# Patient Record
Sex: Male | Born: 1945 | Race: White | Hispanic: No | Marital: Married | Smoking: Current some day smoker
Health system: Southern US, Community
[De-identification: ages and names within clinical notes are randomized; demographics above are authoritative.]

## PROBLEM LIST (undated history)

## (undated) DIAGNOSIS — G4700 Insomnia, unspecified: Secondary | ICD-10-CM

## (undated) DIAGNOSIS — G4730 Sleep apnea, unspecified: Secondary | ICD-10-CM

## (undated) DIAGNOSIS — I499 Cardiac arrhythmia, unspecified: Secondary | ICD-10-CM

## (undated) DIAGNOSIS — K219 Gastro-esophageal reflux disease without esophagitis: Secondary | ICD-10-CM

## (undated) DIAGNOSIS — M545 Low back pain, unspecified: Secondary | ICD-10-CM

## (undated) DIAGNOSIS — G2581 Restless legs syndrome: Secondary | ICD-10-CM

## (undated) DIAGNOSIS — Z86010 Personal history of colonic polyps: Secondary | ICD-10-CM

## (undated) DIAGNOSIS — Z8709 Personal history of other diseases of the respiratory system: Secondary | ICD-10-CM

## (undated) DIAGNOSIS — F4310 Post-traumatic stress disorder, unspecified: Secondary | ICD-10-CM

## (undated) DIAGNOSIS — F329 Major depressive disorder, single episode, unspecified: Secondary | ICD-10-CM

## (undated) DIAGNOSIS — M1990 Unspecified osteoarthritis, unspecified site: Secondary | ICD-10-CM

## (undated) DIAGNOSIS — E119 Type 2 diabetes mellitus without complications: Secondary | ICD-10-CM

## (undated) DIAGNOSIS — F32A Depression, unspecified: Secondary | ICD-10-CM

## (undated) DIAGNOSIS — T8859XA Other complications of anesthesia, initial encounter: Secondary | ICD-10-CM

## (undated) DIAGNOSIS — T4145XA Adverse effect of unspecified anesthetic, initial encounter: Secondary | ICD-10-CM

## (undated) DIAGNOSIS — I10 Essential (primary) hypertension: Secondary | ICD-10-CM

## (undated) DIAGNOSIS — J449 Chronic obstructive pulmonary disease, unspecified: Secondary | ICD-10-CM

## (undated) DIAGNOSIS — E785 Hyperlipidemia, unspecified: Secondary | ICD-10-CM

## (undated) DIAGNOSIS — R253 Fasciculation: Secondary | ICD-10-CM

## (undated) DIAGNOSIS — N400 Enlarged prostate without lower urinary tract symptoms: Secondary | ICD-10-CM

## (undated) HISTORY — PX: COLONOSCOPY: SHX174

## (undated) HISTORY — PX: CARDIOVERSION: SHX1299

## (undated) HISTORY — PX: CERVICAL FUSION: SHX112

## (undated) HISTORY — PX: OTHER SURGICAL HISTORY: SHX169

## (undated) HISTORY — PX: CHOLECYSTECTOMY: SHX55

## (undated) HISTORY — PX: APPENDECTOMY: SHX54

## (undated) HISTORY — PX: ESOPHAGOGASTRODUODENOSCOPY: SHX1529

---

## 2013-07-18 ENCOUNTER — Other Ambulatory Visit: Payer: Self-pay | Admitting: Neurosurgery

## 2013-07-25 NOTE — H&P (Signed)
> [address] , [place] [address] Phone: [phone]   Patient ID:   [id number] Patient: Glenn PlumpMichael Morgan  Date of Birth: 1946/04/28 Visit Type: Office Visit   Date: 07/17/2013 12:45 PM Provider: Danae OrleansJoseph D. Venetia MaxonStern MD   This 68 year old male presents for Follow Up of Neck pain.  History of Present Illness: 1.  Follow Up of Neck pain  Glenn Morgan returns to review his MRI  Imaging on Canopy  Patient had previous right-sided incision and anterior cervical decompression and fusion C3 through C6 levels.  He now has significant ptosis at the C6 C7 level which I believe is the basis for his symptomatic myelopathy.  I have recommended that he undergo surgery and this consisted of anterior cervical decompression and fusion C6 C6 7 with removal of prior hardware C3 through C6 levels.  This will be performed from right-sided approach.  He will need cardiac clearance and plan for Coumadin prior to and after surgery.  He is fitted for a soft cervical collar today.  He continues to have significant low back pain and lumbar spondylolisthesis and I told him that we would need to address his cervical spinal issues and then discuss those in the future.      Medical/Surgical/Interim History Reviewed, no change.  Last detailed document date:07/01/2013.   PAST MEDICAL HISTORY, SURGICAL HISTORY, FAMILY HISTORY, SOCIAL HISTORY AND REVIEW OF SYSTEMS I have reviewed the patient's past medical, surgical, family and social history as well as the comprehensive review of systems as included on the [facility] history form dated 07/01/2013, which I have signed.  Family History: Reviewed, no changes.  Last detailed document: 07/01/2013.   Social History: Tobacco use reviewed. Reviewed, no changes. Last detailed document date: 07/01/2013.      MEDICATIONS(added, continued or stopped this visit):   Started Medication Directions Instruction Stopped    Asmanex Twisthaler 220 mcg (30 doses) breath activated inhale 1 puff by inhalation route  every day at nighttime     aspirin 81 mg tablet,delayed release take 1 tablet by oral route  every day     diltiazem CD 240 mg capsule,extended release 24 hr take 1 capsule by oral route  every day     fenofibrate nanocrystallized 145 mg tablet take 1 tablet by oral route  every day     gabapentin 300 mg capsule take 2 capsule by oral route 3 times every day     glipizide 5 mg tablet take 1 tablet by oral route  every day before meals     hydrochlorothiazide 25 mg tablet take 1 tablet by oral route  every day     lisinopril 40 mg tablet take 1/2 tablet by oral route  every day     magnesium oxide 400 mg tablet 2 tablets daily     metformin 1,000 mg tablet take 1 tablet by oral route 2 times every day with morning and evening meals     metoprolol tartrate 50 mg tablet take 1/2 tablet by oral route once a day     Norco 5 mg-325 mg tablet take 1 tablet by oral route  every 6 hours as needed for pain     omeprazole 20 mg tablet,delayed release take 1 capsule by oral route  every day     Proventil HFA 90 mcg/actuation aerosol inhaler as needed     tamsulosin ER 0.4 mg capsule,extended release 24 hr take 1 capsule by oral route  every day  1/2 hour following the same meal each day     venlafaxine 75 mg tablet take 1 tablet by oral route 2 times every day with food     Vitamin B-12 500 mcg tablet 1 tablet daily     warfarin 5 mg tablet Take 7.5 mg on Mondays and 5 mg all other days     zolpidem 10 mg tablet take 1 tablet by oral route  every day at bedtime as needed      ALLERGIES:  Ingredient Reaction Medication Name Comment  KETOPROFEN   sores in mouth  Reviewed, no changes.   Vitals Date Temp F BP Pulse Ht In Wt Lb BMI BSA Pain Score  07/17/2013  103/74 94 68 206 31.32  0/10      DIAGNOSTIC RESULTS Diagnostic report text  CLINICAL DATA: Neck pain with bilateral arm pain and numbness. History of  cervical fusion in 2011.  EXAM: MRI CERVICAL SPINE WITHOUT CONTRAST  TECHNIQUE: Multiplanar, multisequence MR imaging was performed. No intravenous contrast was administered.  COMPARISON: None.  FINDINGS: The anterior loop coil could not be utilized due to patient body habitus. Detail is mildly limited.  The alignment is near anatomic. The patient is status post C3-6 ACDF with an anterior plate and screws. 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 The spinal canal is relatively small on a congenital basis. There is no evidence of acute fracture.  The craniocervical junction appears normal. The cervical cord is normal in signal and caliber. There are bilateral vertebral artery flow voids.  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 No spinal stenosis.  C3-4: Prior ACDF. There is mild residual uncinate spurring contributing to mild left greater than right foraminal stenosis. No cord deformity.  C4-5: Prior ACDF. There is mild residual uncinate spurring contributing to mild left greater than right foraminal stenosis. No cord deformity.  C5-6: Prior ACDF. Residual uncinate spurring contributes to mild to moderate foraminal narrowing bilaterally. No cord deformity.  C6-7: Adjacent segment disease with posterior osteophytes covering diffusely bulging disc material. The AP diameter of the canal is narrowed to 9 mm. There is no cord deformity. Moderate foraminal narrowing is present, worse on the left.  C7-T1: Left-greater-than-right facet hypertrophy with associated marrow edema. No significant spinal stenosis or nerve root encroachment.  IMPRESSION: 1. Prior C3-6 ACDF. There is some residual uncinate spurring contributing to mild to moderate foraminal narrowing as detailed above. 2. 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 3. 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 4. Left-greater-than-right  facet hypertrophy at C7-T1 without resulting spinal stenosis or nerve root encroachment.   Electronically Signed By: Roxy Horseman M.D. On: 07/11/2013 17:37   Embedded Images (not for diagnostic purposes)        IMPRESSION Patient has significant cervical stenosis at C6 C7 with an AP diameter of the spinal canal at 5.8 mm.  I have recommended that he undergo anterior cervical decompression and fusion at C6 C7 level with expiration of prior fusion C3 through C6 levels.  His lumbar spinal issues will be addressed at a later date.  Completed Orders (this encounter) Order Details Reason Side Interpretation Result Initial Treatment Date Region  Lifestyle education regarding diet f/u with pcp        Cervical Spine- AP/Lat/Flex/Ex W/SWIMMER'S     07/17/2013 All Levels to All Levels   Assessment/Plan # Detail Type Description   1. Assessment BMI 31.0-31.9,ADULT (V85.31).   Plan Orders Today's instructions /  counseling include(s) Lifestyle education regarding diet.       2. Assessment Cervical myelopathy (721.1).       3. 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       4. Assessment Cervical spinal stenosis (723.0).       5. Assessment Neck pain (723.1).         Pain Assessment/Treatment Pain Scale: 0/10. Method: Numeric Pain Intensity Scale.  Risks and benefits were discussed in detail with the patient wishes to proceed with surgery.  This is been scheduled for 08/02/13  Orders: Diagnostic Procedures: Assessment Procedure  721.1 Cervical Spine- AP/Lat/Flex/Ex  723.0 ACDF  - C6-C7 exploration of fusion C 3 - C 6 levels (right approach)  Instruction(s)/Education: Assessment Instruction  V85.31 Lifestyle education regarding diet             Provider:  Danae Orleans. Venetia Maxon MD  07/20/2013 06:01 PM Dictation edited by: Danae Orleans. Venetia Maxon    CC Providers: Maeola Harman MD [address] [place], [place]  [address]  ----------------------------------------------------------------------------------------------------------------------------------------------------------------------         Electronically signed by Danae Orleans. Venetia Maxon MD on 07/20/2013 06:02 PM   [address] [place], [place] [address] Phone: [phone]   Patient ID:   [id number] Patient: Glenn Morgan  Date of Birth: 1945-08-24 Visit Type: Office Visit   Date: 07/01/2013 03:30 PM Provider: Danae Orleans. Venetia Maxon MD   This 68 year old male presents for back pain.  History of Present Illness: 1.  back pain  Glenn Morgan, 68y.o. retired male, visits reporting some lumbar pain with walking, but voices his primary concern is uncontrollable leg spasms bilaterally.  He recalls no injury, but notes s/s began following myelogram in 2011 before his cervical surgery in [place].   Rx: Norco 5/325 prn, Gabapentin 300mg  2 TID.Marland KitchenMarland KitchenCoumadin for A.Fib  Hx: HTN, A.Fib., Barretts Esophagus, GERD, NIDDM, PTSD SxHx: Cervical surgery - DrSinger; 2012 Rt Rotator Cuff; 2001 Lt Rotator Cuff;  Appendix 1958; Gallbladder  1981 He has reduced his smoking from 2PPD to 3/4 PPD   Imaging on Canopy  Patient is complaining of low back and bilateral lower extremity pain and spasms.  He complains of back pain with walking.  He says this all began after myelogram he had in 2011.  He also notes numbness in both of his feet and weakness in both of his arms.  He is on Coumadin for atrial fibrillation.  He had prior cervical spinal surgery in May 2011.  He was performed by Dr. Krista Blue in [place].  Patient notes that he is only able to walk 50 yards a 40-assisted down because of low back and bilateral lower extremity pain.        PAST MEDICAL/SURGICAL HISTORY   (Detailed)  Disease/disorder Onset Date Management Date Comments    Cholecystectomy 1981     Hernia repair 1982     Appendectomy 1958   Atrial fibrillation       Depression      Diabetes type 2      GERD      High cholesterol      Hypertension      Neuropathy          Family History  (Detailed)  Relationship Family Member Name Deceased Age at Death Condition Onset Age Cause of Death      Family history of Hypertension  N  Father    Myocardial infarction  N  Mother    Cancer, ovarian  N   SOCIAL HISTORY  (  Detailed) Tobacco use reviewed. Preferred language is Unknown.   Smoking status: Heavy tobacco smoker.  SMOKING STATUS Use Status Type Smoking Status Usage Per Day Years Used Total Pack Years  yes Cigarette Heavy tobacco smoker 1 Packs     TOBACCO CESSATION INFORMATION Date Counseled By Order Status Description Code Tobacco Cessation Information  07/01/2013      Smoking cessation education          MEDICATIONS(added, continued or stopped this visit):   Started Medication Directions Instruction Stopped   Asmanex Twisthaler 220 mcg (30 doses) breath activated inhale 1 puff by inhalation route  every day at nighttime     aspirin 81 mg tablet,delayed release take 1 tablet by oral route  every day     diltiazem CD 240 mg capsule,extended release 24 hr take 1 capsule by oral route  every day     fenofibrate nanocrystallized 145 mg tablet take 1 tablet by oral route  every day     gabapentin 300 mg capsule take 2 capsule by oral route 3 times every day     glipizide 5 mg tablet take 1 tablet by oral route  every day before meals     hydrochlorothiazide 25 mg tablet take 1 tablet by oral route  every day     lisinopril 40 mg tablet take 1/2 tablet by oral route  every day     magnesium oxide 400 mg tablet 2 tablets daily     metformin 1,000 mg tablet take 1 tablet by oral route 2 times every day with morning and evening meals     metoprolol tartrate 50 mg tablet take 1/2 tablet by oral route once a day     Norco 5 mg-325 mg tablet take 1 tablet by oral route  every 6 hours as needed for pain     omeprazole 20 mg tablet,delayed  release take 1 capsule by oral route  every day     Proventil HFA 90 mcg/actuation aerosol inhaler as needed     tamsulosin ER 0.4 mg capsule,extended release 24 hr take 1 capsule by oral route  every day 1/2 hour following the same meal each day     venlafaxine 75 mg tablet take 1 tablet by oral route 2 times every day with food     Vitamin B-12 500 mcg tablet 1 tablet daily     warfarin 5 mg tablet Take 7.5 mg on Mondays and 5 mg all other days     zolpidem 10 mg tablet take 1 tablet by oral route  every day at bedtime as needed      ALLERGIES:  Ingredient Reaction Medication Name Comment  KETOPROFEN   sores in mouth  Reviewed, updated.  REVIEW OF SYSTEMS System Neg/Pos Details  Constitutional Negative Chills, fatigue, fever, malaise, night sweats, weight gain and weight loss.  ENMT Negative Ear drainage, hearing loss, nasal drainage, otalgia, sinus pressure and sore throat.  Eyes Negative Eye discharge, eye pain and vision changes.  Respiratory Negative Chronic cough, cough, dyspnea, known TB exposure and wheezing.  Cardio Positive AFib.  GI Negative Abdominal pain, blood in stool, change in stool pattern, constipation, decreased appetite, diarrhea, heartburn, nausea and vomiting.  GU Negative Dribbling, dysuria, erectile dysfunction, hematuria, polyuria, slow stream, urinary frequency, urinary incontinence and urinary retention.  Endocrine Negative Cold intolerance, heat intolerance, polydipsia and polyphagia.  Neuro Negative Dizziness, extremity weakness, gait disturbance, headache, memory impairment, numbness in extremity, seizures and tremors.  Psych Negative Anxiety, depression and  insomnia.  Integumentary Negative Brittle hair, brittle nails, change in shape/size of mole(s), hair loss, hirsutism, hives, pruritus, rash and skin lesion.  MS Positive Back pain, Bilat leg spasms.  Hema/Lymph Negative Easy bleeding, easy bruising and lymphadenopathy.  Allergic/Immuno Negative  Contact allergy, environmental allergies, food allergies and seasonal allergies.  Reproductive Negative Penile discharge and sexual dysfunction.    Vitals Date Temp F BP Pulse Ht In Wt Lb BMI BSA Pain Score  07/01/2013  113/75 109 68 205 31.17  6/10     PHYSICAL EXAM General Level of Distress: no acute distress Overall Appearance: normal  Head and Face  Right Left  Fundoscopic Exam:  normal normal    Cardiovascular Cardiac: regular rate and rhythm without murmur  Right Left  Carotid Pulses: normal normal  Respiratory Lungs: clear to auscultation  Neurological Orientation: normal Recent and Remote Memory: normal Attention Span and Concentration:   normal Language: normal Fund of Knowledge: normal  Right Left Sensation: normal normal Upper Extremity Coordination: normal normal  Lower Extremity Coordination: normal normal  Musculoskeletal Gait and Station: normal  Right Left Upper Extremity Muscle Strength: normal normal Lower Extremity Muscle Strength: normal normal Upper Extremity Muscle Tone:  normal normal Lower Extremity Muscle Tone: normal normal  Motor Strength Upper and lower extremity motor strength was tested in the clinically pertinent muscles .Any abnormal findings will be noted below..   Right Left Grip: 4/5 4/5 Finger Extensor: 4/5 4/5  Deep Tendon Reflexes  Right Left Biceps: increase increase Triceps: increase increase Brachiloradialis: increase increase Patellar: increase increase Achilles: increase increase  Cranial Nerves II. Optic Nerve/Visual Fields: normal III. Oculomotor: normal IV. Trochlear: normal V. Trigeminal: normal VI. Abducens: normal VII. Facial: normal VIII. Acoustic/Vestibular: normal IX. Glossopharyngeal: normal X. Vagus: normal XI. Spinal Accessory: normal XII. Hypoglossal: normal  Motor and other Tests Lhermittes: negative Rhomberg: negative Pronator  drift: absent     Right Left Spurlings negative negative Hoffman's: normal normal Clonus: normal normal Babinski: normal normal SLR: positive at 45 degrees positive at 45 degrees   Additional Findings:  Patient has bilaterally positive crossed adductor and suprapatellar reflexes at the knees.  He is able to stand on his heels and toes and get some relief with leaning forward.    DIAGNOSTIC RESULTS Lumbar spinal radiographs were obtained in the office today which show that he has spondylolisthesis of L3 on L4 and l4 and L5.  In flexion L3 L4 has 11 mm anterolisthesis and 10 mm at L4 L5 these change to 6 mm at L3 L4 and extension and 9 mm at L45 and extension and 7 mm at L3 L4 neutral lateral radiograph and 8 mm at L4 L5 on neutral lateral radiograph.  On MRI of his lumbar spine he has anterolisthesis of L3 on L4 and at L4-L5 with what appears to be autofusion at the L5-S1 level with spondylosis and stenosis at the L3 L4 and L4 L5 levels    IMPRESSION While the patient has lumbar spondylolisthesis and low back pain, he also appears to have significant hyperreflexia and has bilateral hand intrinsic weakness along with numbness into both of his hands.  He has had prior cervical spinal surgery.  I have recommended we obtain a cervical MRI and cervical radiographs to make sure he does not have significant cord compression or basis for myelopathy.  Completed Orders (this encounter) Order Details Reason Side Interpretation Result Initial Treatment Date Region  Dietary management education, guidance, and counseling f/u with pcp  Lumbar Spine- AP/Lat/Obls/Spot/Flex/Ex      07/01/2013 All Levels to All Levels   Assessment/Plan # Detail Type Description   1. Assessment Obesity (278.00).   Plan Orders Today's instructions / counseling include(s) Dietary management education, guidance, and counseling.         Pain Assessment/Treatment Pain Scale: 6/10. Method: Numeric Pain Intensity  Scale. Pain Assessment/Treatment follow-up plan of care: Patient taking pain medication..  I will see the patient back after cervical imaging has been performed.  Orders: Diagnostic Procedures: Assessment Procedure   Lumbar Spine- AP/Lat/Obls/Spot/Flex/Ex  Instruction(s)/Education: Assessment Instruction  278.00 Dietary management education, guidance, and counseling             Provider:  Danae Orleans. Venetia Maxon MD  07/06/2013 03:09 PM Dictation edited by: Danae Orleans. Venetia Maxon    CC Providers: Maeola Harman MD [address] [place], [place] [address]  ----------------------------------------------------------------------------------------------------------------------------------------------------------------------         Electronically signed by Danae Orleans. Venetia Maxon MD on 07/06/2013 03:09 PM

## 2013-07-26 ENCOUNTER — Encounter (HOSPITAL_COMMUNITY): Payer: Self-pay

## 2013-07-26 ENCOUNTER — Encounter (HOSPITAL_COMMUNITY)
Admission: RE | Admit: 2013-07-26 | Discharge: 2013-07-26 | Disposition: A | Discharge status: Home / Self Care | Payer: Medicare Other | Source: Ambulatory Visit | Attending: Neurosurgery | Admitting: Neurosurgery

## 2013-07-26 DIAGNOSIS — Z01812 Encounter for preprocedural laboratory examination: Secondary | ICD-10-CM | POA: Insufficient documentation

## 2013-07-26 HISTORY — DX: Benign prostatic hyperplasia without lower urinary tract symptoms: N40.0

## 2013-07-26 HISTORY — DX: Essential (primary) hypertension: I10

## 2013-07-26 HISTORY — DX: Sleep apnea, unspecified: G47.30

## 2013-07-26 HISTORY — DX: Chronic obstructive pulmonary disease, unspecified: J44.9

## 2013-07-26 HISTORY — DX: Personal history of other diseases of the respiratory system: Z87.09

## 2013-07-26 HISTORY — DX: Fasciculation: R25.3

## 2013-07-26 HISTORY — DX: Insomnia, unspecified: G47.00

## 2013-07-26 HISTORY — DX: Adverse effect of unspecified anesthetic, initial encounter: T41.45XA

## 2013-07-26 HISTORY — DX: Hyperlipidemia, unspecified: E78.5

## 2013-07-26 HISTORY — DX: Cardiac arrhythmia, unspecified: I49.9

## 2013-07-26 HISTORY — DX: Restless legs syndrome: G25.81

## 2013-07-26 HISTORY — DX: Major depressive disorder, single episode, unspecified: F32.9

## 2013-07-26 HISTORY — DX: Depression, unspecified: F32.A

## 2013-07-26 HISTORY — DX: Other complications of anesthesia, initial encounter: T88.59XA

## 2013-07-26 HISTORY — DX: Personal history of colonic polyps: Z86.010

## 2013-07-26 HISTORY — DX: Gastro-esophageal reflux disease without esophagitis: K21.9

## 2013-07-26 HISTORY — DX: Post-traumatic stress disorder, unspecified: F43.10

## 2013-07-26 HISTORY — DX: Low back pain: M54.5

## 2013-07-26 HISTORY — DX: Low back pain, unspecified: M54.50

## 2013-07-26 HISTORY — DX: Unspecified osteoarthritis, unspecified site: M19.90

## 2013-07-26 HISTORY — DX: Type 2 diabetes mellitus without complications: E11.9

## 2013-07-26 LAB — CBC
HCT: 42.2 % (ref 39.0–52.0)
HEMOGLOBIN: 14.7 g/dL (ref 13.0–17.0)
MCH: 31 pg (ref 26.0–34.0)
MCHC: 34.8 g/dL (ref 30.0–36.0)
MCV: 89 fL (ref 78.0–100.0)
PLATELETS: 226 10*3/uL (ref 150–400)
RBC: 4.74 MIL/uL (ref 4.22–5.81)
RDW: 14.6 % (ref 11.5–15.5)
WBC: 7.2 10*3/uL (ref 4.0–10.5)

## 2013-07-26 LAB — BASIC METABOLIC PANEL
BUN: 14 mg/dL (ref 6–23)
CO2: 27 mEq/L (ref 19–32)
Calcium: 9.1 mg/dL (ref 8.4–10.5)
Chloride: 96 mEq/L (ref 96–112)
Creatinine, Ser: 0.76 mg/dL (ref 0.50–1.35)
GLUCOSE: 183 mg/dL — AB (ref 70–99)
POTASSIUM: 4.1 meq/L (ref 3.7–5.3)
SODIUM: 138 meq/L (ref 137–147)

## 2013-07-26 LAB — SURGICAL PCR SCREEN
MRSA, PCR: NEGATIVE
STAPHYLOCOCCUS AUREUS: POSITIVE — AB

## 2013-07-26 NOTE — Progress Notes (Signed)
Pt notified of positive PCR of staph. Rx for Mupirocin ointment called into [facility] on [address] [place].

## 2013-07-26 NOTE — Pre-Procedure Instructions (Signed)
Wanda PlumpMichael Principato  07/26/2013   Your procedure is scheduled on:  Fri, Mar 27@ 7:30 AM  [facility]  at 5:30 AM.  Call this number if you have problems the morning of surgery: [phone]   Remember:   Do not eat food or drink liquids after midnight.   Take these medicines the morning of surgery with A SIP OF WATER: Diltiazem(Cardizem),Gabapentin(Neurontin),Metoprolol(Lopressor),Pain Pill(if needed),Omeprazole(Prilosec),Proventin<Bring Your Inhaler With You>,Effexor(Venlafaxine),and Flomax(Tamsulosin)               Stop taking your Coumadin as instructed. No Goody's,BC's,Aleve,Aspirin,Ibuprofen,Fish Oil,or any Herbal Medications   Do not wear jewelry  Do not wear lotions, powders, or colognes. You may wear deodorant.  Men may shave face and neck.  Do not bring valuables to the hospital.  [facility] is not responsible                  for any belongings or valuables.               Contacts, dentures or bridgework may not be worn into surgery.  Leave suitcase in the car. After surgery it may be brought to your room.  For patients admitted to the hospital, discharge time is determined by your                treatment team.               Patients discharged the day of surgery will not be allowed to drive  home.    Special Instructions:  [place] - Preparing for Surgery  Before surgery, you can play an important role.  Because skin is not sterile, your skin needs to be as free of germs as possible.  You can reduce the number of germs on you skin by washing with CHG (chlorahexidine gluconate) soap before surgery.  CHG is an antiseptic cleaner which kills germs and bonds with the skin to continue killing germs even after washing.  Please DO NOT use if you have an allergy to CHG or antibacterial soaps.  If your skin becomes reddened/irritated stop using the CHG and inform your nurse when you arrive at Short Stay.  Do not shave (including legs and underarms) for at least  48 hours prior to the first CHG shower.  You may shave your face.  Please follow these instructions carefully:   1.  Shower with CHG Soap the night before surgery and the                                morning of Surgery.  2.  If you choose to wash your hair, wash your hair first as usual with your       normal shampoo.  3.  After you shampoo, rinse your hair and body thoroughly to remove the                      Shampoo.  4.  Use CHG as you would any other liquid soap.  You can apply chg directly       to the skin and wash gently with scrungie or a clean washcloth.  5.  Apply the CHG Soap to your body ONLY FROM THE NECK DOWN.        Do not use on open wounds or open sores.  Avoid contact with your eyes,       ears, mouth and  genitals (private parts).  Wash genitals (private parts)       with your normal soap.  6.  Wash thoroughly, paying special attention to the area where your surgery        will be performed.  7.  Thoroughly rinse your body with warm water from the neck down.  8.  DO NOT shower/wash with your normal soap after using and rinsing off       the CHG Soap.  9.  Pat yourself dry with a clean towel.            10.  Wear clean pajamas.            11.  Place clean sheets on your bed the night of your first shower and do not        sleep with pets.  Day of Surgery  Do not apply any lotions/deoderants the morning of surgery.  Please wear clean clothes to the hospital/surgery center.     Please read over the following fact sheets that you were given: Pain Booklet, Coughing and Deep Breathing, MRSA Information and Surgical Site Infection Prevention

## 2013-07-26 NOTE — Progress Notes (Addendum)
[place] [facility].Next visit next week-will need to request report  [facility]  Stress [place] around 2011[place]   Denies ever having a heart cath  [place]  [place]   [place] [facility]

## 2013-07-29 ENCOUNTER — Encounter (HOSPITAL_COMMUNITY): Payer: Self-pay | Admitting: Pharmacy Technician

## 2013-07-29 NOTE — Progress Notes (Addendum)
Anesthesia chart review: Patient is a [age] year old male scheduled for right C6-7 ACDF with exploration of fusion C3-6 on 08/02/13 by Dr. Venetia MaxonStern.  History includes smoking, afib with history of cardioversion '07 with recurrence (on Coumadin), HTN, HLD, COPD, OSA without CPAP use, RLS, GERD, DM2, BPH, depression, PTSD, cervical fusion '11 (Dr. Krista BlueSinger in [place], [place]), arthritis cholecystectomy. PCP is Dr. Wallace Cullensharles Bethea in [place].  Cardiologist is Dr. Gerilyn PilgrimJarmukli with [place] [facility].  He saw Leonie ManKatherine Henley, NP on 06/03/13, but with reported a visit scheduled for this week. Dr. Venetia MaxonStern has requested cardiac clearance, but his office is still waiting to hear back from Dr. Gerilyn PilgrimJarmukli.  EKG on 06/03/13 [facility]) showed: Afib @ 95 bpm, non-specific T wave abnormality.  Echo on 09/04/09 [facility]) showed: Left ventricular systolic function is normal. Left ventricle is normal in size. Left atrium is mildly enlarged. Right atrium is normal in size. Right ventricle is normal in size. Right ventricular contractility is normal. Aortic root is normal in size.   Myoview on 02/12/08 [facility]) showed: 1. There is no definite evidence of ischemia. 2. Normal left ventricular cavity. 3. There is mild global hypokinesis. 4. Left ventricular ejection fraction is 40%.  CXR on 02/04/13 [facility]) showed: Cardiomegaly with no active lung disease identified. There is the previously noted marked elevation of the left hemidiaphragm and this remains unchanged. Right CP angle is sharp. No mediastinal widening or abnormality observed. Impression: No active disease. Marked elevation, benign in appearance, of the left hemidiaphragm.  Preoperative labs noted.  He will need a PT/PTT preoperatively. Jessica at Dr. Fredrich BirksStern's office reports that patient was told to hold Coumadin for 5 days preoperatively and to check with his cardiologist regarding if he needed a Lovenox bridge.  As above, patient reported a visit with cardiology this week. Shanda BumpsJessica with follow-up  with patient and his cardiologist regarding clearance.  Velna Ochsllison Mumin Denomme, PA-C [facility]/Anesthesiology Phone [phone] 07/29/2013 11:25 AM  Addendum: 07/30/2013 6:25 PM Received a note of cardiac clearance today from Dr. Gerilyn PilgrimJarmukli stating that patient is low risk for moderate risk surgery, continue cardiac medications on the day of surgery, may hold warfarin without bridging as needed preoperatively.

## 2013-08-01 MED ORDER — CEFAZOLIN SODIUM-DEXTROSE 2-3 GM-% IV SOLR
2.0000 g | INTRAVENOUS | Status: AC
  Administered 2013-08-02: 2 g via INTRAVENOUS
  Filled 2013-08-01: qty 50

## 2013-08-02 ENCOUNTER — Encounter (HOSPITAL_COMMUNITY)
Admission: RE | Disposition: A | Discharge status: Home / Self Care | Payer: Self-pay | Source: Ambulatory Visit | Attending: Neurosurgery

## 2013-08-02 ENCOUNTER — Encounter (HOSPITAL_COMMUNITY): Payer: Self-pay | Admitting: *Deleted

## 2013-08-02 ENCOUNTER — Inpatient Hospital Stay (HOSPITAL_COMMUNITY): Payer: Medicare Other | Admitting: Certified Registered"

## 2013-08-02 ENCOUNTER — Inpatient Hospital Stay (HOSPITAL_COMMUNITY)
Admission: RE | Admit: 2013-08-02 | Discharge: 2013-08-05 | DRG: 472 | Disposition: A | Discharge status: Home / Self Care | Payer: Medicare Other | Source: Ambulatory Visit | Attending: Neurosurgery | Admitting: Neurosurgery

## 2013-08-02 ENCOUNTER — Encounter (HOSPITAL_COMMUNITY): Payer: Medicare Other | Admitting: Vascular Surgery

## 2013-08-02 ENCOUNTER — Inpatient Hospital Stay (HOSPITAL_COMMUNITY): Payer: Medicare Other

## 2013-08-02 DIAGNOSIS — Z981 Arthrodesis status: Secondary | ICD-10-CM

## 2013-08-02 DIAGNOSIS — E669 Obesity, unspecified: Secondary | ICD-10-CM | POA: Diagnosis present

## 2013-08-02 DIAGNOSIS — M129 Arthropathy, unspecified: Secondary | ICD-10-CM | POA: Diagnosis present

## 2013-08-02 DIAGNOSIS — F17200 Nicotine dependence, unspecified, uncomplicated: Secondary | ICD-10-CM

## 2013-08-02 DIAGNOSIS — M545 Low back pain, unspecified: Secondary | ICD-10-CM | POA: Diagnosis present

## 2013-08-02 DIAGNOSIS — N400 Enlarged prostate without lower urinary tract symptoms: Secondary | ICD-10-CM | POA: Diagnosis present

## 2013-08-02 DIAGNOSIS — E1149 Type 2 diabetes mellitus with other diabetic neurological complication: Secondary | ICD-10-CM | POA: Diagnosis present

## 2013-08-02 DIAGNOSIS — E119 Type 2 diabetes mellitus without complications: Secondary | ICD-10-CM | POA: Diagnosis present

## 2013-08-02 DIAGNOSIS — E1142 Type 2 diabetes mellitus with diabetic polyneuropathy: Secondary | ICD-10-CM | POA: Diagnosis present

## 2013-08-02 DIAGNOSIS — G2581 Restless legs syndrome: Secondary | ICD-10-CM | POA: Diagnosis present

## 2013-08-02 DIAGNOSIS — Z7982 Long term (current) use of aspirin: Secondary | ICD-10-CM

## 2013-08-02 DIAGNOSIS — R0902 Hypoxemia: Secondary | ICD-10-CM

## 2013-08-02 DIAGNOSIS — E785 Hyperlipidemia, unspecified: Secondary | ICD-10-CM | POA: Diagnosis present

## 2013-08-02 DIAGNOSIS — J9819 Other pulmonary collapse: Secondary | ICD-10-CM | POA: Diagnosis not present

## 2013-08-02 DIAGNOSIS — J9811 Atelectasis: Secondary | ICD-10-CM

## 2013-08-02 DIAGNOSIS — I4891 Unspecified atrial fibrillation: Secondary | ICD-10-CM | POA: Diagnosis present

## 2013-08-02 DIAGNOSIS — J986 Disorders of diaphragm: Secondary | ICD-10-CM | POA: Diagnosis present

## 2013-08-02 DIAGNOSIS — J441 Chronic obstructive pulmonary disease with (acute) exacerbation: Secondary | ICD-10-CM

## 2013-08-02 DIAGNOSIS — K219 Gastro-esophageal reflux disease without esophagitis: Secondary | ICD-10-CM | POA: Diagnosis present

## 2013-08-02 DIAGNOSIS — M4722 Other spondylosis with radiculopathy, cervical region: Secondary | ICD-10-CM

## 2013-08-02 DIAGNOSIS — G4700 Insomnia, unspecified: Secondary | ICD-10-CM | POA: Diagnosis present

## 2013-08-02 DIAGNOSIS — I10 Essential (primary) hypertension: Secondary | ICD-10-CM | POA: Diagnosis present

## 2013-08-02 DIAGNOSIS — Z7901 Long term (current) use of anticoagulants: Secondary | ICD-10-CM

## 2013-08-02 DIAGNOSIS — J449 Chronic obstructive pulmonary disease, unspecified: Secondary | ICD-10-CM

## 2013-08-02 DIAGNOSIS — M4712 Other spondylosis with myelopathy, cervical region: Principal | ICD-10-CM | POA: Diagnosis present

## 2013-08-02 HISTORY — PX: ANTERIOR CERVICAL DECOMP/DISCECTOMY FUSION: SHX1161

## 2013-08-02 LAB — GLUCOSE, CAPILLARY
GLUCOSE-CAPILLARY: 153 mg/dL — AB (ref 70–99)
Glucose-Capillary: 152 mg/dL — ABNORMAL HIGH (ref 70–99)
Glucose-Capillary: 254 mg/dL — ABNORMAL HIGH (ref 70–99)

## 2013-08-02 LAB — HEMOGLOBIN A1C
Hgb A1c MFr Bld: 7.9 % — ABNORMAL HIGH
Mean Plasma Glucose: 180 mg/dL — ABNORMAL HIGH

## 2013-08-02 LAB — PROTIME-INR
INR: 1.1 (ref 0.00–1.49)
Prothrombin Time: 14 seconds (ref 11.6–15.2)

## 2013-08-02 LAB — APTT: APTT: 30 s (ref 24–37)

## 2013-08-02 SURGERY — ANTERIOR CERVICAL DECOMPRESSION/DISCECTOMY FUSION 1 LEVEL/HARDWARE REMOVAL
Anesthesia: General | Site: Neck

## 2013-08-02 MED ORDER — HYDROCHLOROTHIAZIDE 25 MG PO TABS
25.0000 mg | ORAL_TABLET | Freq: Every day | ORAL | Status: DC
  Administered 2013-08-03: 25 mg via ORAL
  Filled 2013-08-02 (×4): qty 1

## 2013-08-02 MED ORDER — ALUM & MAG HYDROXIDE-SIMETH 200-200-20 MG/5ML PO SUSP
30.0000 mL | Freq: Four times a day (QID) | ORAL | Status: DC | PRN
  Administered 2013-08-03: 30 mL via ORAL
  Filled 2013-08-02: qty 30

## 2013-08-02 MED ORDER — ALBUTEROL SULFATE HFA 108 (90 BASE) MCG/ACT IN AERS
INHALATION_SPRAY | RESPIRATORY_TRACT | Status: DC | PRN
  Administered 2013-08-02 (×2): 2 via RESPIRATORY_TRACT

## 2013-08-02 MED ORDER — ONDANSETRON HCL 4 MG/2ML IJ SOLN: 4.0000 mg | INTRAMUSCULAR | Status: DC | PRN

## 2013-08-02 MED ORDER — MENTHOL 3 MG MT LOZG: 1.0000 | LOZENGE | OROMUCOSAL | Status: DC | PRN

## 2013-08-02 MED ORDER — KCL IN DEXTROSE-NACL 20-5-0.45 MEQ/L-%-% IV SOLN
INTRAVENOUS | Status: DC
  Administered 2013-08-02: 14:00:00 via INTRAVENOUS
  Filled 2013-08-02 (×3): qty 1000

## 2013-08-02 MED ORDER — PROPOFOL 10 MG/ML IV BOLUS
INTRAVENOUS | Status: AC
  Filled 2013-08-02: qty 20

## 2013-08-02 MED ORDER — DILTIAZEM HCL ER 240 MG PO CP24
240.0000 mg | ORAL_CAPSULE | Freq: Every day | ORAL | Status: DC
  Administered 2013-08-02 – 2013-08-05 (×4): 240 mg via ORAL
  Filled 2013-08-02 (×4): qty 1

## 2013-08-02 MED ORDER — ROCURONIUM BROMIDE 50 MG/5ML IV SOLN
INTRAVENOUS | Status: AC
  Filled 2013-08-02: qty 1

## 2013-08-02 MED ORDER — ALBUTEROL SULFATE (2.5 MG/3ML) 0.083% IN NEBU
INHALATION_SOLUTION | RESPIRATORY_TRACT | Status: AC
  Filled 2013-08-02: qty 3

## 2013-08-02 MED ORDER — MAGNESIUM OXIDE 400 (241.3 MG) MG PO TABS
800.0000 mg | ORAL_TABLET | Freq: Every day | ORAL | Status: DC
  Administered 2013-08-02 – 2013-08-05 (×4): 800 mg via ORAL
  Filled 2013-08-02 (×4): qty 2

## 2013-08-02 MED ORDER — SODIUM CHLORIDE 0.9 % IJ SOLN: 3.0000 mL | INTRAMUSCULAR | Status: DC | PRN

## 2013-08-02 MED ORDER — GLYCOPYRROLATE 0.2 MG/ML IJ SOLN
INTRAMUSCULAR | Status: DC | PRN
  Administered 2013-08-02 (×2): 0.4 mg via INTRAVENOUS

## 2013-08-02 MED ORDER — ASPIRIN EC 81 MG PO TBEC
81.0000 mg | DELAYED_RELEASE_TABLET | Freq: Every day | ORAL | Status: DC
  Administered 2013-08-02 – 2013-08-05 (×4): 81 mg via ORAL
  Filled 2013-08-02 (×4): qty 1

## 2013-08-02 MED ORDER — GLYCOPYRROLATE 0.2 MG/ML IJ SOLN
INTRAMUSCULAR | Status: AC
  Filled 2013-08-02: qty 2

## 2013-08-02 MED ORDER — 0.9 % SODIUM CHLORIDE (POUR BTL) OPTIME
TOPICAL | Status: DC | PRN
  Administered 2013-08-02: 1000 mL

## 2013-08-02 MED ORDER — TAMSULOSIN HCL 0.4 MG PO CAPS
0.4000 mg | ORAL_CAPSULE | Freq: Every day | ORAL | Status: DC
  Administered 2013-08-02 – 2013-08-05 (×4): 0.4 mg via ORAL
  Filled 2013-08-02 (×4): qty 1

## 2013-08-02 MED ORDER — LIDOCAINE HCL 4 % MT SOLN
OROMUCOSAL | Status: DC | PRN
  Administered 2013-08-02: 4 mL via TOPICAL

## 2013-08-02 MED ORDER — PANTOPRAZOLE SODIUM 40 MG IV SOLR: 40.0000 mg | Freq: Every day | INTRAVENOUS | Status: DC

## 2013-08-02 MED ORDER — INSULIN ASPART 100 UNIT/ML ~~LOC~~ SOLN
0.0000 [IU] | Freq: Three times a day (TID) | SUBCUTANEOUS | Status: DC
  Administered 2013-08-03: 2 [IU] via SUBCUTANEOUS
  Administered 2013-08-03: 3 [IU] via SUBCUTANEOUS

## 2013-08-02 MED ORDER — GELATIN ABSORBABLE MT POWD
OROMUCOSAL | Status: DC | PRN
  Administered 2013-08-02: 09:00:00 via TOPICAL

## 2013-08-02 MED ORDER — HYDROMORPHONE HCL PF 1 MG/ML IJ SOLN
0.2500 mg | INTRAMUSCULAR | Status: DC | PRN
  Administered 2013-08-02 (×3): 0.25 mg via INTRAVENOUS

## 2013-08-02 MED ORDER — BISACODYL 10 MG RE SUPP
10.0000 mg | Freq: Every day | RECTAL | Status: DC | PRN
  Administered 2013-08-03 – 2013-08-04 (×2): 10 mg via RECTAL
  Filled 2013-08-02 (×2): qty 1

## 2013-08-02 MED ORDER — MAGNESIUM OXIDE 400 MG PO TABS: 800.0000 mg | ORAL_TABLET | Freq: Every day | ORAL | Status: DC

## 2013-08-02 MED ORDER — HYDROCODONE-ACETAMINOPHEN 5-325 MG PO TABS: 1.0000 | ORAL_TABLET | Freq: Four times a day (QID) | ORAL | Status: DC | PRN

## 2013-08-02 MED ORDER — ARTIFICIAL TEARS OP OINT
TOPICAL_OINTMENT | OPHTHALMIC | Status: AC
  Filled 2013-08-02: qty 3.5

## 2013-08-02 MED ORDER — PHENYLEPHRINE HCL 10 MG/ML IJ SOLN
INTRAMUSCULAR | Status: DC | PRN
  Administered 2013-08-02 (×2): 80 ug via INTRAVENOUS

## 2013-08-02 MED ORDER — MIDAZOLAM HCL 2 MG/2ML IJ SOLN
INTRAMUSCULAR | Status: AC
  Filled 2013-08-02: qty 2

## 2013-08-02 MED ORDER — LACTATED RINGERS IV SOLN
INTRAVENOUS | Status: DC | PRN
  Administered 2013-08-02 (×2): via INTRAVENOUS

## 2013-08-02 MED ORDER — MORPHINE SULFATE 2 MG/ML IJ SOLN: 1.0000 mg | INTRAMUSCULAR | Status: DC | PRN

## 2013-08-02 MED ORDER — NEOSTIGMINE METHYLSULFATE 1 MG/ML IJ SOLN
INTRAMUSCULAR | Status: DC | PRN
  Administered 2013-08-02: 4 mg via INTRAVENOUS

## 2013-08-02 MED ORDER — MIDAZOLAM HCL 5 MG/5ML IJ SOLN
INTRAMUSCULAR | Status: DC | PRN
  Administered 2013-08-02: 1 mg via INTRAVENOUS

## 2013-08-02 MED ORDER — CEFAZOLIN SODIUM 1-5 GM-% IV SOLN
1.0000 g | Freq: Three times a day (TID) | INTRAVENOUS | Status: AC
  Administered 2013-08-02 (×2): 1 g via INTRAVENOUS
  Filled 2013-08-02 (×2): qty 50

## 2013-08-02 MED ORDER — FENTANYL CITRATE 0.05 MG/ML IJ SOLN
INTRAMUSCULAR | Status: DC | PRN
  Administered 2013-08-02: 50 ug via INTRAVENOUS
  Administered 2013-08-02: 100 ug via INTRAVENOUS
  Administered 2013-08-02 (×2): 50 ug via INTRAVENOUS

## 2013-08-02 MED ORDER — ALBUTEROL SULFATE HFA 108 (90 BASE) MCG/ACT IN AERS
INHALATION_SPRAY | RESPIRATORY_TRACT | Status: AC
Start: 2013-08-02 — End: 2013-08-02
  Filled 2013-08-02: qty 6.7

## 2013-08-02 MED ORDER — METFORMIN HCL 500 MG PO TABS: 1000.0000 mg | ORAL_TABLET | Freq: Two times a day (BID) | ORAL | Status: DC

## 2013-08-02 MED ORDER — ONDANSETRON HCL 4 MG/2ML IJ SOLN: 4.0000 mg | Freq: Once | INTRAMUSCULAR | Status: DC | PRN

## 2013-08-02 MED ORDER — PHENOL 1.4 % MT LIQD: 1.0000 | OROMUCOSAL | Status: DC | PRN

## 2013-08-02 MED ORDER — GLIPIZIDE 5 MG PO TABS
5.0000 mg | ORAL_TABLET | Freq: Every day | ORAL | Status: DC
  Administered 2013-08-03 – 2013-08-05 (×2): 5 mg via ORAL
  Filled 2013-08-02 (×5): qty 1

## 2013-08-02 MED ORDER — ACETAMINOPHEN 650 MG RE SUPP: 650.0000 mg | RECTAL | Status: DC | PRN

## 2013-08-02 MED ORDER — NEOSTIGMINE METHYLSULFATE 1 MG/ML IJ SOLN
INTRAMUSCULAR | Status: AC
  Filled 2013-08-02: qty 10

## 2013-08-02 MED ORDER — LIDOCAINE HCL (CARDIAC) 20 MG/ML IV SOLN
INTRAVENOUS | Status: DC | PRN
  Administered 2013-08-02: 60 mg via INTRAVENOUS

## 2013-08-02 MED ORDER — METFORMIN HCL 500 MG PO TABS
1000.0000 mg | ORAL_TABLET | Freq: Two times a day (BID) | ORAL | Status: DC
  Administered 2013-08-02 – 2013-08-04 (×4): 1000 mg via ORAL
  Filled 2013-08-02 (×8): qty 2

## 2013-08-02 MED ORDER — THROMBIN 5000 UNITS EX SOLR
CUTANEOUS | Status: DC | PRN
  Administered 2013-08-02 (×2): 5000 [IU] via TOPICAL

## 2013-08-02 MED ORDER — DEXAMETHASONE SODIUM PHOSPHATE 10 MG/ML IJ SOLN
INTRAMUSCULAR | Status: DC | PRN
  Administered 2013-08-02: 10 mg via INTRAVENOUS

## 2013-08-02 MED ORDER — SODIUM CHLORIDE 0.9 % IJ SOLN
INTRAMUSCULAR | Status: AC
  Filled 2013-08-02: qty 3

## 2013-08-02 MED ORDER — FLEET ENEMA 7-19 GM/118ML RE ENEM
1.0000 | ENEMA | Freq: Once | RECTAL | Status: AC | PRN
  Filled 2013-08-02: qty 1

## 2013-08-02 MED ORDER — VENLAFAXINE HCL 75 MG PO TABS
75.0000 mg | ORAL_TABLET | Freq: Two times a day (BID) | ORAL | Status: DC
  Administered 2013-08-02 – 2013-08-05 (×7): 75 mg via ORAL
  Filled 2013-08-02 (×8): qty 1

## 2013-08-02 MED ORDER — INSULIN ASPART 100 UNIT/ML ~~LOC~~ SOLN
0.0000 [IU] | Freq: Every day | SUBCUTANEOUS | Status: DC
  Administered 2013-08-02: 3 [IU] via SUBCUTANEOUS

## 2013-08-02 MED ORDER — INSULIN ASPART 100 UNIT/ML ~~LOC~~ SOLN
4.0000 [IU] | Freq: Three times a day (TID) | SUBCUTANEOUS | Status: DC
  Administered 2013-08-03 (×2): 4 [IU] via SUBCUTANEOUS

## 2013-08-02 MED ORDER — ARTIFICIAL TEARS OP OINT
TOPICAL_OINTMENT | OPHTHALMIC | Status: DC | PRN
  Administered 2013-08-02: 1 via OPHTHALMIC

## 2013-08-02 MED ORDER — SENNA 8.6 MG PO TABS
1.0000 | ORAL_TABLET | Freq: Two times a day (BID) | ORAL | Status: DC
  Administered 2013-08-03 – 2013-08-05 (×5): 8.6 mg via ORAL
  Filled 2013-08-02 (×7): qty 1

## 2013-08-02 MED ORDER — CYANOCOBALAMIN 500 MCG PO TABS
500.0000 ug | ORAL_TABLET | Freq: Every day | ORAL | Status: DC
  Administered 2013-08-02 – 2013-08-05 (×4): 500 ug via ORAL
  Filled 2013-08-02 (×4): qty 1

## 2013-08-02 MED ORDER — ZOLPIDEM TARTRATE 5 MG PO TABS: 5.0000 mg | ORAL_TABLET | Freq: Every evening | ORAL | Status: DC | PRN

## 2013-08-02 MED ORDER — HEMOSTATIC AGENTS (NO CHARGE) OPTIME
TOPICAL | Status: DC | PRN
  Administered 2013-08-02: 1 via TOPICAL

## 2013-08-02 MED ORDER — FENOFIBRATE 54 MG PO TABS
54.0000 mg | ORAL_TABLET | Freq: Every day | ORAL | Status: DC
  Administered 2013-08-02 – 2013-08-05 (×4): 54 mg via ORAL
  Filled 2013-08-02 (×4): qty 1

## 2013-08-02 MED ORDER — ALBUTEROL SULFATE (2.5 MG/3ML) 0.083% IN NEBU
2.5000 mg | INHALATION_SOLUTION | RESPIRATORY_TRACT | Status: AC | PRN
  Administered 2013-08-02 (×2): 2.5 mg via RESPIRATORY_TRACT

## 2013-08-02 MED ORDER — LISINOPRIL 20 MG PO TABS
20.0000 mg | ORAL_TABLET | Freq: Every day | ORAL | Status: DC
  Administered 2013-08-03 – 2013-08-05 (×3): 20 mg via ORAL
  Filled 2013-08-02 (×4): qty 1

## 2013-08-02 MED ORDER — ALBUTEROL SULFATE (2.5 MG/3ML) 0.083% IN NEBU
2.5000 mg | INHALATION_SOLUTION | Freq: Four times a day (QID) | RESPIRATORY_TRACT | Status: DC
  Administered 2013-08-02 – 2013-08-03 (×4): 2.5 mg via RESPIRATORY_TRACT
  Filled 2013-08-02 (×3): qty 3

## 2013-08-02 MED ORDER — FLUTICASONE PROPIONATE HFA 44 MCG/ACT IN AERO
2.0000 | INHALATION_SPRAY | Freq: Two times a day (BID) | RESPIRATORY_TRACT | Status: DC
  Administered 2013-08-02 – 2013-08-04 (×3): 2 via RESPIRATORY_TRACT
  Filled 2013-08-02 (×2): qty 10.6

## 2013-08-02 MED ORDER — ACETAMINOPHEN 325 MG PO TABS: 650.0000 mg | ORAL_TABLET | ORAL | Status: DC | PRN

## 2013-08-02 MED ORDER — METOPROLOL SUCCINATE ER 50 MG PO TB24
50.0000 mg | ORAL_TABLET | Freq: Two times a day (BID) | ORAL | Status: DC
  Administered 2013-08-02 – 2013-08-05 (×6): 50 mg via ORAL
  Filled 2013-08-02 (×7): qty 1

## 2013-08-02 MED ORDER — KCL IN DEXTROSE-NACL 20-5-0.45 MEQ/L-%-% IV SOLN
INTRAVENOUS | Status: AC
  Filled 2013-08-02: qty 1000

## 2013-08-02 MED ORDER — GABAPENTIN 300 MG PO CAPS
600.0000 mg | ORAL_CAPSULE | Freq: Three times a day (TID) | ORAL | Status: DC
  Administered 2013-08-02 – 2013-08-05 (×10): 600 mg via ORAL
  Filled 2013-08-02 (×11): qty 2

## 2013-08-02 MED ORDER — PANTOPRAZOLE SODIUM 40 MG PO TBEC
40.0000 mg | DELAYED_RELEASE_TABLET | Freq: Every day | ORAL | Status: DC
  Administered 2013-08-02 – 2013-08-05 (×4): 40 mg via ORAL
  Filled 2013-08-02 (×3): qty 1

## 2013-08-02 MED ORDER — FENTANYL CITRATE 0.05 MG/ML IJ SOLN
INTRAMUSCULAR | Status: AC
  Filled 2013-08-02: qty 5

## 2013-08-02 MED ORDER — DOCUSATE SODIUM 100 MG PO CAPS
100.0000 mg | ORAL_CAPSULE | Freq: Two times a day (BID) | ORAL | Status: DC
  Administered 2013-08-02 – 2013-08-05 (×6): 100 mg via ORAL
  Filled 2013-08-02 (×7): qty 1

## 2013-08-02 MED ORDER — LIDOCAINE-EPINEPHRINE 1 %-1:100000 IJ SOLN
INTRAMUSCULAR | Status: DC | PRN
  Administered 2013-08-02: 5 mL

## 2013-08-02 MED ORDER — DIAZEPAM 5 MG PO TABS
5.0000 mg | ORAL_TABLET | Freq: Four times a day (QID) | ORAL | Status: DC | PRN
  Filled 2013-08-02: qty 1

## 2013-08-02 MED ORDER — SENNOSIDES-DOCUSATE SODIUM 8.6-50 MG PO TABS
1.0000 | ORAL_TABLET | Freq: Every evening | ORAL | Status: DC | PRN
  Administered 2013-08-02 (×2): 1 via ORAL
  Filled 2013-08-02: qty 1

## 2013-08-02 MED ORDER — PROPOFOL 10 MG/ML IV BOLUS
INTRAVENOUS | Status: DC | PRN
  Administered 2013-08-02: 200 mg via INTRAVENOUS

## 2013-08-02 MED ORDER — ONDANSETRON HCL 4 MG/2ML IJ SOLN
INTRAMUSCULAR | Status: DC | PRN
  Administered 2013-08-02: 4 mg via INTRAVENOUS

## 2013-08-02 MED ORDER — OXYCODONE-ACETAMINOPHEN 5-325 MG PO TABS: 1.0000 | ORAL_TABLET | ORAL | Status: DC | PRN

## 2013-08-02 MED ORDER — BUPIVACAINE HCL (PF) 0.5 % IJ SOLN
INTRAMUSCULAR | Status: DC | PRN
  Administered 2013-08-02: 5 mL

## 2013-08-02 MED ORDER — ROCURONIUM BROMIDE 100 MG/10ML IV SOLN
INTRAVENOUS | Status: DC | PRN
  Administered 2013-08-02: 10 mg via INTRAVENOUS
  Administered 2013-08-02: 20 mg via INTRAVENOUS
  Administered 2013-08-02: 50 mg via INTRAVENOUS

## 2013-08-02 MED ORDER — PHENYLEPHRINE HCL 10 MG/ML IJ SOLN
10.0000 mg | INTRAVENOUS | Status: DC | PRN
  Administered 2013-08-02: 50 ug/min via INTRAVENOUS

## 2013-08-02 MED ORDER — SODIUM CHLORIDE 0.9 % IJ SOLN
3.0000 mL | Freq: Two times a day (BID) | INTRAMUSCULAR | Status: DC
Start: 2013-08-02 — End: 2013-08-05
  Administered 2013-08-03 – 2013-08-04 (×3): 3 mL via INTRAVENOUS

## 2013-08-02 MED ORDER — ALBUTEROL SULFATE (2.5 MG/3ML) 0.083% IN NEBU
3.0000 mL | INHALATION_SOLUTION | Freq: Four times a day (QID) | RESPIRATORY_TRACT | Status: DC | PRN
  Filled 2013-08-02: qty 3

## 2013-08-02 MED ORDER — ONDANSETRON HCL 4 MG/2ML IJ SOLN
INTRAMUSCULAR | Status: AC
  Filled 2013-08-02: qty 2

## 2013-08-02 MED ORDER — LIDOCAINE HCL (CARDIAC) 20 MG/ML IV SOLN
INTRAVENOUS | Status: AC
  Filled 2013-08-02: qty 5

## 2013-08-02 MED ORDER — HYDROCODONE-ACETAMINOPHEN 5-325 MG PO TABS
1.0000 | ORAL_TABLET | ORAL | Status: DC | PRN
  Administered 2013-08-02 – 2013-08-05 (×6): 2 via ORAL
  Filled 2013-08-02 (×6): qty 2

## 2013-08-02 SURGICAL SUPPLY — 74 items
BAG DECANTER FOR FLEXI CONT (MISCELLANEOUS) IMPLANT
BANDAGE GAUZE ELAST BULKY 4 IN (GAUZE/BANDAGES/DRESSINGS) ×6 IMPLANT
BENZOIN TINCTURE PRP APPL 2/3 (GAUZE/BANDAGES/DRESSINGS) ×3 IMPLANT
BIT DRILL NEURO 2X3.1 SFT TUCH (MISCELLANEOUS) ×1 IMPLANT
BLADE ULTRA TIP 2M (BLADE) IMPLANT
BUR BARREL STRAIGHT FLUTE 4.0 (BURR) ×3 IMPLANT
CANISTER SUCT 3000ML (MISCELLANEOUS) ×3 IMPLANT
CLOSURE WOUND 1/2 X4 (GAUZE/BANDAGES/DRESSINGS) ×1
CONT SPEC 4OZ CLIKSEAL STRL BL (MISCELLANEOUS) ×3 IMPLANT
COVER MAYO STAND STRL (DRAPES) ×3 IMPLANT
DERMABOND ADHESIVE PROPEN (GAUZE/BANDAGES/DRESSINGS) ×2
DERMABOND ADVANCED (GAUZE/BANDAGES/DRESSINGS)
DERMABOND ADVANCED .7 DNX12 (GAUZE/BANDAGES/DRESSINGS) IMPLANT
DERMABOND ADVANCED .7 DNX6 (GAUZE/BANDAGES/DRESSINGS) ×1 IMPLANT
DRAPE LAPAROTOMY 100X72 PEDS (DRAPES) ×3 IMPLANT
DRAPE MICROSCOPE LEICA (MISCELLANEOUS) ×3 IMPLANT
DRAPE POUCH INSTRU U-SHP 10X18 (DRAPES) ×3 IMPLANT
DRAPE PROXIMA HALF (DRAPES) IMPLANT
DRESSING TELFA 8X3 (GAUZE/BANDAGES/DRESSINGS) IMPLANT
DRILL NEURO 2X3.1 SOFT TOUCH (MISCELLANEOUS) ×3
DURAPREP 6ML APPLICATOR 50/CS (WOUND CARE) ×3 IMPLANT
ELECT COATED BLADE 2.86 ST (ELECTRODE) ×3 IMPLANT
ELECT REM PT RETURN 9FT ADLT (ELECTROSURGICAL) ×3
ELECTRODE REM PT RTRN 9FT ADLT (ELECTROSURGICAL) ×1 IMPLANT
GAUZE SPONGE 4X4 16PLY XRAY LF (GAUZE/BANDAGES/DRESSINGS) IMPLANT
GLOVE BIO SURGEON STRL SZ8 (GLOVE) ×3 IMPLANT
GLOVE BIOGEL PI IND STRL 7.0 (GLOVE) ×2 IMPLANT
GLOVE BIOGEL PI IND STRL 7.5 (GLOVE) ×3 IMPLANT
GLOVE BIOGEL PI IND STRL 8 (GLOVE) ×1 IMPLANT
GLOVE BIOGEL PI IND STRL 8.5 (GLOVE) ×1 IMPLANT
GLOVE BIOGEL PI INDICATOR 7.0 (GLOVE) ×4
GLOVE BIOGEL PI INDICATOR 7.5 (GLOVE) ×6
GLOVE BIOGEL PI INDICATOR 8 (GLOVE) ×2
GLOVE BIOGEL PI INDICATOR 8.5 (GLOVE) ×2
GLOVE ECLIPSE 7.0 STRL STRAW (GLOVE) ×3 IMPLANT
GLOVE ECLIPSE 7.5 STRL STRAW (GLOVE) ×6 IMPLANT
GLOVE ECLIPSE 8.0 STRL XLNG CF (GLOVE) ×3 IMPLANT
GLOVE EXAM NITRILE LRG STRL (GLOVE) IMPLANT
GLOVE EXAM NITRILE MD LF STRL (GLOVE) IMPLANT
GLOVE EXAM NITRILE XL STR (GLOVE) IMPLANT
GLOVE EXAM NITRILE XS STR PU (GLOVE) IMPLANT
GLOVE SS BIOGEL STRL SZ 6.5 (GLOVE) ×2 IMPLANT
GLOVE SUPERSENSE BIOGEL SZ 6.5 (GLOVE) ×4
GOWN BRE IMP SLV AUR LG STRL (GOWN DISPOSABLE) IMPLANT
GOWN BRE IMP SLV AUR XL STRL (GOWN DISPOSABLE) IMPLANT
GOWN STRL REIN 2XL LVL4 (GOWN DISPOSABLE) IMPLANT
HALTER HD/CHIN CERV TRACTION D (MISCELLANEOUS) ×3 IMPLANT
HEMOSTAT POWDER SURGIFOAM 1G (HEMOSTASIS) ×3 IMPLANT
HEMOSTAT SURGICEL 2X14 (HEMOSTASIS) IMPLANT
KIT BASIN OR (CUSTOM PROCEDURE TRAY) ×3 IMPLANT
KIT ROOM TURNOVER OR (KITS) ×3 IMPLANT
NEEDLE HYPO 18GX1.5 BLUNT FILL (NEEDLE) ×3 IMPLANT
NEEDLE HYPO 25X1 1.5 SAFETY (NEEDLE) ×3 IMPLANT
NEEDLE SPNL 22GX3.5 QUINCKE BK (NEEDLE) ×3 IMPLANT
NS IRRIG 1000ML POUR BTL (IV SOLUTION) ×3 IMPLANT
PACK LAMINECTOMY NEURO (CUSTOM PROCEDURE TRAY) ×3 IMPLANT
PAD ARMBOARD 7.5X6 YLW CONV (MISCELLANEOUS) ×9 IMPLANT
PEEK LORDOTIC 8MM (Peek) ×3 IMPLANT
PIN DISTRACTION 14MM (PIN) ×6 IMPLANT
PUTTY BONE DBX 2.5 MIS (Bone Implant) ×3 IMPLANT
RUBBERBAND STERILE (MISCELLANEOUS) ×6 IMPLANT
SCREW SELF DRILLING 16MM (Screw) ×6 IMPLANT
SPONGE GAUZE 4X4 12PLY (GAUZE/BANDAGES/DRESSINGS) IMPLANT
SPONGE INTESTINAL PEANUT (DISPOSABLE) ×6 IMPLANT
SPONGE SURGIFOAM ABS GEL SZ50 (HEMOSTASIS) ×3 IMPLANT
STAPLER SKIN PROX WIDE 3.9 (STAPLE) ×3 IMPLANT
STRIP CLOSURE SKIN 1/2X4 (GAUZE/BANDAGES/DRESSINGS) ×2 IMPLANT
SUT VIC AB 3-0 SH 8-18 (SUTURE) ×6 IMPLANT
SYR 20ML ECCENTRIC (SYRINGE) ×3 IMPLANT
SYR 3ML LL SCALE MARK (SYRINGE) IMPLANT
TOWEL OR 17X24 6PK STRL BLUE (TOWEL DISPOSABLE) ×3 IMPLANT
TOWEL OR 17X26 10 PK STRL BLUE (TOWEL DISPOSABLE) ×3 IMPLANT
TRAP SPECIMEN MUCOUS 40CC (MISCELLANEOUS) IMPLANT
WATER STERILE IRR 1000ML POUR (IV SOLUTION) ×3 IMPLANT

## 2013-08-02 NOTE — Progress Notes (Signed)
Utilization review completed.  

## 2013-08-02 NOTE — Transfer of Care (Signed)
Immediate Anesthesia Transfer of Care Note  Patient: Glenn PlumpMichael Morgan  Procedure(s) Performed: Procedure(s) with comments:  Right Cervical six-seven Anterior cervical decompression/diskectomy fusion with exploration of fusion Cervical three-six (N/A) -  Right C6-7 Anterior cervical decompression/diskectomy fusion with exploration of fusion C3-6  Patient Location: PACU  Anesthesia Type:General  Level of Consciousness: awake, alert  and oriented  Airway & Oxygen Therapy: Patient Spontanous Breathing and Patient connected to face mask oxygen  Post-op Assessment: Report given to PACU RN, Post -op Vital signs reviewed and stable and Patient moving all extremities X 4  Post vital signs: Reviewed and stable  Complications: No apparent anesthesia complications

## 2013-08-02 NOTE — Anesthesia Procedure Notes (Signed)
Procedure Name: Intubation Date/Time: 08/02/2013 7:33 AM Performed by: Lanell MatarBAKER, Reneisha Stilley M Pre-anesthesia Checklist: Patient identified, Timeout performed, Emergency Drugs available, Suction available and Patient being monitored Patient Re-evaluated:Patient Re-evaluated prior to inductionOxygen Delivery Method: Circle system utilized Preoxygenation: Pre-oxygenation with 100% oxygen Intubation Type: IV induction Ventilation: Mask ventilation without difficulty and Oral airway inserted - appropriate to patient size Laryngoscope Size: Hyacinth MeekerMiller and 2 Grade View: Grade II Tube type: Oral Tube size: 7.5 mm Number of attempts: 1 Airway Equipment and Method: Stylet Placement Confirmation: ETT inserted through vocal cords under direct vision,  breath sounds checked- equal and bilateral,  positive ETCO2 and CO2 detector Secured at: 22 cm Tube secured with: Tape Dental Injury: Teeth and Oropharynx as per pre-operative assessment  Comments: During intubation, the neck was maintained in a neutral position

## 2013-08-02 NOTE — Progress Notes (Signed)
Dr Katrinka BlazingSmith here to see pt. OK to tx him to [facility]. Pt is awake and alert, VSS,RR 18-20, unlabored, he appears comf. And has no c/o

## 2013-08-02 NOTE — Op Note (Signed)
08/02/2013  10:07 AM  PATIENT:  Glenn Morgan  68 y.o. 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  PROCEDURE:  Procedure(s) with comments:  Right Cervical six-seven Anterior cervical decompression/diskectomy fusion with exploration of fusion Cervical three-six (N/A) -  Right C6-7 Anterior cervical decompression/diskectomy fusion with exploration of fusion C3-6 PEEK cage, plate and autograft/allograft  SURGEON:  Surgeon(s) and Role:    * Karynn Deblasi, MD - Primary    * Neelesh Nundkumar, MD - Assisting  PHYSICIAN ASSISTANT:   ASSISTANTS: Poteat, RN   ANESTHESIA:   general  EBL:  Total I/O In: 1000 [I.V.:1000] Out: -   BLOOD ADMINISTERED:none  DRAINS: none   LOCAL MEDICATIONS USED:  LIDOCAINE   SPECIMEN:  No Specimen  DISPOSITION OF SPECIMEN:  N/A  COUNTS:  YES  TOURNIQUET:  * No tourniquets in log *  DICTATION: Patient is 68 year old male with prior fusion by another surgeon of  C3 to C 6 with stenosis, myelopathy, cord compression, HNP at C 67 level.  It was elected to take him to surgery for exploration of fusion with anterior cervical decompression and fusion C 67 level.  PROCEDURE: Patient was brought to operating room and following the smooth and uncomplicated induction of general endotracheal anesthesia his head was placed on a horseshoe head holder he was placed in 5 pounds of Holter traction and his anterior neck was prepped and draped in usual sterile fashion. An incision was made on the right side of midline after infiltrating the skin and subcutaneous tissues with local lidocaine through previous incision. The platysmal layer was incised and subplatysmal dissection was performed exposing the anterior border sternocleidomastoid muscle. Using blunt  dissection the carotid sheath was kept lateral and trachea and esophagus kept medial exposing the anterior cervical spine. The previously placed plate was cleared of investing scar tissue and the interfaces with bone at previous levels were carefully inspected and found to be solidly fused without hardware loosening.  The C 67 level was then exposed and  Longus coli muscles were taken down from the anterior cervical spine using electrocautery and key elevator and self-retaining retractor was placed exposing the C 67 level. The interspace was incised and a thorough discectomy was performed. Distraction pins were placed. Uncinate spurs and central spondylitic ridges were drilled down with a high-speed drill. The spinal cord dura and both C 7 nerve roots were widely decompressed. Hemostasis was assured. After trial sizing an 8 mm lordotic Zero P implant was sized and inserted after trial sizing  was selected and packed with local autograft and demineralized bone matrix. This was tamped into position and countersunk appropriately. Distraction weight was removed. Two 16 mm screws were affixed to the cervical spine , one at C 6 and one at C 7.  The right sided screw was removed from the plate prior to placing the new screw. All screws were well-positioned and locking mechanisms were engaged. A final X ray was obtained which did not demonstrate the implant due to patient's large body habitus.. Soft tissues were inspected and found to be in good repair. The wound was irrigated. The platysma layer was closed with 3-0 Vicryl stitches and the skin was reapproximated with 3-0 Vicryl subcuticular stitches. The wound was dressed with Dermabond. Counts were correct at the end of the case. Patient was extubated and taken to   recovery in stable and satisfactory condition.   PLAN OF CARE: Admit for overnight observation  PATIENT DISPOSITION:  PACU - hemodynamically stable.   Delay start of Pharmacological VTE agent (>24hrs) due  to surgical blood loss or risk of bleeding: yes  

## 2013-08-02 NOTE — Progress Notes (Signed)
Awake, alert, MAEW. Receiving breathing treatment for COPD.

## 2013-08-02 NOTE — Progress Notes (Signed)
Pt transferred to 2W after episode of decreased SpO2 and wheezing per family. Currently, pt denies DIB, SOB. No c/o currently.  EXAM:  BP 106/73  Pulse 83  Temp(Src) 97.6 F (36.4 C) (Oral)  Resp 17  SpO2 96%  Awake, alert, oriented  Speech fluent, appropriate  CN grossly intact  Neck is soft  IMPRESSION:  68 y.o. male s/p ACDF with likely exacerbation of COPD, does not have s/sx consistent with neck hematoma or airway edema.  PLAN: - Cont to monitor - Routine postop care

## 2013-08-02 NOTE — Interval H&P Note (Signed)
History and Physical Interval Note:  08/02/2013 7:14 AM  Glenn PlumpMichael Cardon  has presented today for surgery, with the diagnosis of Cervical stenosis, cervical degenerative disc disease, Cervical spondylosis with myelopathy  The various methods of treatment have been discussed with the patient and family. After consideration of risks, benefits and other options for treatment, the patient has consented to  Procedure(s) with comments:  Right C6-7 Anterior cervical decompression/diskectomy fusion with exploration of fusion C3-6 (N/A) -  Right C6-7 Anterior cervical decompression/diskectomy fusion with exploration of fusion C3-6 as a surgical intervention .  The patient's history has been reviewed, patient examined, no change in status, stable for surgery.  I have reviewed the patient's chart and labs.  Questions were answered to the patient's satisfaction.     Estela Vinal D

## 2013-08-02 NOTE — Anesthesia Postprocedure Evaluation (Signed)
  Anesthesia Post-op Note  Patient: Glenn PlumpMichael Zuckerman  Procedure(s) Performed: Procedure(s) with comments:  Right Cervical six-seven Anterior cervical decompression/diskectomy fusion with exploration of fusion Cervical three-six (N/A) -  Right C6-7 Anterior cervical decompression/diskectomy fusion with exploration of fusion C3-6  Patient Location: PACU  Anesthesia Type:General  Level of Consciousness: awake, alert , oriented and patient cooperative  Airway and Oxygen Therapy: Patient Spontanous Breathing  Post-op Pain: mild  Post-op Assessment: Post-op Vital signs reviewed, Patient's Cardiovascular Status Stable, RESPIRATORY FUNCTION UNSTABLE, Patent Airway, No signs of Nausea or vomiting and Pain level controlled  Post-op Vital Signs: stable  Complications: No apparent anesthesia complications

## 2013-08-02 NOTE — Anesthesia Preprocedure Evaluation (Addendum)
Anesthesia Evaluation  Patient identified by MRN, date of birth, ID band Patient awake    Airway Mallampati: II TM Distance: >3 FB Neck ROM: Full    Dental  (+) Teeth Intact, Dental Advisory Given, Poor Dentition,    Pulmonary sleep apnea and Continuous Positive Airway Pressure Ventilation , COPD COPD inhaler, Current Smoker,          Cardiovascular hypertension, + dysrhythmias Atrial Fibrillation     Neuro/Psych Depression    GI/Hepatic GERD-  ,  Endo/Other  diabetes, Type 2, Oral Hypoglycemic Agents  Renal/GU      Musculoskeletal   Abdominal   Peds  Hematology   Anesthesia Other Findings   Reproductive/Obstetrics                         Anesthesia Physical Anesthesia Plan  ASA: III  Anesthesia Plan: General   Post-op Pain Management:    Induction: Intravenous  Airway Management Planned: Oral ETT  Additional Equipment:   Intra-op Plan:   Post-operative Plan: Extubation in OR  Informed Consent: I have reviewed the patients History and Physical, chart, labs and discussed the procedure including the risks, benefits and alternatives for the proposed anesthesia with the patient or authorized representative who has indicated his/her understanding and acceptance.     Plan Discussed with:   Anesthesia Plan Comments:         Anesthesia Quick Evaluation

## 2013-08-02 NOTE — Brief Op Note (Signed)
08/02/2013  10:07 AM  PATIENT:  Glenn Morgan  68 y.o. 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  PROCEDURE:  Procedure(s) with comments:  Right Cervical six-seven Anterior cervical decompression/diskectomy fusion with exploration of fusion Cervical three-six (N/A) -  Right C6-7 Anterior cervical decompression/diskectomy fusion with exploration of fusion C3-6 PEEK cage, plate and autograft/allograft  SURGEON:  Surgeon(s) and Role:    * Maeola HarmanJoseph Preslei Blakley, MD - Primary    * Lisbeth RenshawNeelesh Nundkumar, MD - Assisting  PHYSICIAN ASSISTANT:   ASSISTANTS: Poteat, RN   ANESTHESIA:   general  EBL:  Total I/O In: 1000 [I.V.:1000] Out: -   BLOOD ADMINISTERED:none  DRAINS: none   LOCAL MEDICATIONS USED:  LIDOCAINE   SPECIMEN:  No Specimen  DISPOSITION OF SPECIMEN:  N/A  COUNTS:  YES  TOURNIQUET:  * No tourniquets in log *  DICTATION: Patient is 68 year old male with prior fusion by another surgeon of  C3 to C 6 with stenosis, myelopathy, cord compression, HNP at C 67 level.  It was elected to take him to surgery for exploration of fusion with anterior cervical decompression and fusion C 67 level.  PROCEDURE: Patient was brought to operating room and following the smooth and uncomplicated induction of general endotracheal anesthesia his head was placed on a horseshoe head holder he was placed in 5 pounds of Holter traction and his anterior neck was prepped and draped in usual sterile fashion. An incision was made on the right side of midline after infiltrating the skin and subcutaneous tissues with local lidocaine through previous incision. The platysmal layer was incised and subplatysmal dissection was performed exposing the anterior border sternocleidomastoid muscle. Using blunt  dissection the carotid sheath was kept lateral and trachea and esophagus kept medial exposing the anterior cervical spine. The previously placed plate was cleared of investing scar tissue and the interfaces with bone at previous levels were carefully inspected and found to be solidly fused without hardware loosening.  The C 67 level was then exposed and  Longus coli muscles were taken down from the anterior cervical spine using electrocautery and key elevator and self-retaining retractor was placed exposing the C 67 level. The interspace was incised and a thorough discectomy was performed. Distraction pins were placed. Uncinate spurs and central spondylitic ridges were drilled down with a high-speed drill. The spinal cord dura and both C 7 nerve roots were widely decompressed. Hemostasis was assured. After trial sizing an 8 mm lordotic Zero P implant was sized and inserted after trial sizing  was selected and packed with local autograft and demineralized bone matrix. This was tamped into position and countersunk appropriately. Distraction weight was removed. Two 16 mm screws were affixed to the cervical spine , one at C 6 and one at C 7.  The right sided screw was removed from the plate prior to placing the new screw. All screws were well-positioned and locking mechanisms were engaged. A final X ray was obtained which did not demonstrate the implant due to patient's large body habitus.. Soft tissues were inspected and found to be in good repair. The wound was irrigated. The platysma layer was closed with 3-0 Vicryl stitches and the skin was reapproximated with 3-0 Vicryl subcuticular stitches. The wound was dressed with Dermabond. Counts were correct at the end of the case. Patient was extubated and taken to  recovery in stable and satisfactory condition.   PLAN OF CARE: Admit for overnight observation  PATIENT DISPOSITION:  PACU - hemodynamically stable.   Delay start of Pharmacological VTE agent (>24hrs) due  to surgical blood loss or risk of bleeding: yes

## 2013-08-02 NOTE — Progress Notes (Signed)
Patient arrived to unit.on a stretcher  appears to be comfortable. Assessment documented and vitals noted to be WNL with the exception of oxygen saturation being <90%. Oxygen via Nasal canula was applied and still oxygen saturation was <90%. RN increased oxygen saturation to 4Litre and still saturation is <90%. Respiratory was paged for further assessment and medication management. Patient was transferred to telemetery unit for monitoring.

## 2013-08-03 ENCOUNTER — Inpatient Hospital Stay (HOSPITAL_COMMUNITY): Payer: Medicare Other

## 2013-08-03 LAB — GLUCOSE, CAPILLARY
GLUCOSE-CAPILLARY: 177 mg/dL — AB (ref 70–99)
GLUCOSE-CAPILLARY: 136 mg/dL — AB (ref 70–99)
Glucose-Capillary: 103 mg/dL — ABNORMAL HIGH (ref 70–99)
Glucose-Capillary: 102 mg/dL — ABNORMAL HIGH (ref 70–99)

## 2013-08-03 MED ORDER — SODIUM CHLORIDE 0.9 % IV SOLN
INTRAVENOUS | Status: DC
  Administered 2013-08-03 – 2013-08-04 (×2): via INTRAVENOUS

## 2013-08-03 NOTE — Evaluation (Signed)
Occupational Therapy Evaluation and Discharge Patient Details Name: Glenn PlumpMichael Marcelli MRN: [id number] DOB: [birth year] Today's Date: 08/03/2013    History of Present Illness Right Cervical six-seven Anterior cervical decompression/diskectomy fusion with exploration of fusion Cervical three-six (N/A) -  Right C6-7 Anterior cervical decompression/diskectomy fusion with exploration of fusion C3-6 PEEK cage, plate and autograft/allograft. Pt transferred to 2W from [facility] after episode of decreased SpO2 and wheezing per family   Clinical Impression   This 68 yo male admitted and underwent above with post op complication of dropping O2 sats. Have completed all education with patient and wife as well as stairs with pt. Pt's main issue now is drop in O2 sats when on RA (down as low as 75%)--all education completed on ways he could work on making this better. Acute OT will sign off and no PT needs noted--pt just needs to ambulate and work on breathing techniques.    Follow Up Recommendations  No OT follow up    Equipment Recommendations  None recommended by OT       Precautions / Restrictions Precautions Precautions: Cervical Required Braces or Orthoses: Cervical Brace Cervical Brace: Soft collar;At all times Restrictions Weight Bearing Restrictions: No      Mobility Bed Mobility Overal bed mobility: Modified Independent                Transfers Overall transfer level: Independent               General transfer comment: Mod I up and down 4 steps with rail (what he has in order to get into his house).          ADL                     General ADL Comments: Wife will A pt with whatever LB ADLs he may struggle with. They are aware that pt should not be bending over. Educated pt and wife on purse lipped breathing as well as using inspirometer 10x/every waking hour               Pertinent Vitals/Pain 89% on RA at rest, 75% on RA with ambulation and stairs, up to 93%  on 3 liters of O2 with finishing walk and cuing for purse lipped breathing     Hand Dominance Right   Extremity/Trunk Assessment Upper Extremity Assessment Upper Extremity Assessment: Overall WFL for tasks assessed (advised pt to not raise his arms above 90 degrees)           Communication Communication Communication: No difficulties   Cognition Arousal/Alertness: Awake/alert Behavior During Therapy: WFL for tasks assessed/performed Overall Cognitive Status: Within Functional Limits for tasks assessed                             Home Living Family/patient expects to be discharged to:: Private residence Living Arrangements: Spouse/significant other Available Help at Discharge: Family Type of Home: House Home Access: Stairs to enter Secretary/administratorntrance Stairs-Number of Steps: 4 Entrance Stairs-Rails: Right Home Layout: One level     Bathroom Shower/Tub: Walk-in shower;Door   Foot LockerBathroom Toilet: Standard     Home Equipment: Grab bars - tub/shower          Prior Functioning/Environment Level of Independence: Independent                      OT Goals(Current goals can be found in the care plan section) Acute Rehab OT Goals  Patient Stated Goal: Home sooner than later             End of Session: Equipment Utilized During Treatment: Oxygen;Cervical collar  Activity Tolerance: Patient tolerated treatment well Patient left: with family/visitor present (siting EOB)   Time: 1610-9604 OT Time Calculation (min): 37 min Charges:  OT General Charges $OT Visit: 1 Procedure OT Evaluation $Initial OT Evaluation Tier I: 1 Procedure OT Treatments $Self Care/Home Management : 23-37 mins  Evette Georges [phone] 08/03/2013, 10:32 AM

## 2013-08-03 NOTE — Clinical Social Work Note (Signed)
CSW received follow-up for d/c planning needs for SNF placement. CSW reviewed patient's chart and per OT/PT notes, patient not appropriate for skilled needs at this time. Please re-consult if further needs arise.   Balen Woolum Patrick-Jefferson, LCSWA Weekend Clinical Social Worker [phone]

## 2013-08-03 NOTE — Progress Notes (Signed)
Physical Therapy Discharge Patient Details Name: Glenn PlumpMichael Kea MRN: [id number] DOB: 03-23-1946 Today's Date: 08/03/2013 Time:  -     Patient discharged from PT services secondary to per OT no PT needs.  Will sign off at this time..   08/03/2013  Clyde BingKen Kalynn Declercq, PT [phone]  (pager)      Yoshiye Kraft, [name] 08/03/2013, 10:21 AM

## 2013-08-03 NOTE — Progress Notes (Signed)
SATURATION QUALIFICATIONS: (This note is used to comply with regulatory documentation for home oxygen)  Patient Saturations on Room Air at Rest = 89%  Patient Saturations on Room Air while Ambulating = 75%  Patient Saturations on 3 Liters of oxygen while Ambulating = 93%  Please briefly explain why patient needs home oxygen:

## 2013-08-03 NOTE — Progress Notes (Signed)
No issues overnight. Pt has been ambulatory but reports SOB after walking with decreased sats. Otherwise no c/o DIB, difficulty swallowing.  EXAM:  BP 91/57  Pulse 52  Temp(Src) 97.4 F (36.3 C) (Oral)  Resp 18  SpO2 90%  Awake, alert, oriented  Speech fluent, appropriate  CN grossly intact  5/5 BUE/BLE  Wound c/d/i  IMPRESSION:  68 y.o. male POD# 1 s/p C6-7 ACDF, neurologically at baseline with decreased sats likely due to baseline COPD  PLAN: - Consult medicine for COPD - Plan on d/c tomorrow

## 2013-08-04 ENCOUNTER — Encounter (HOSPITAL_COMMUNITY): Payer: Self-pay | Admitting: Radiology

## 2013-08-04 ENCOUNTER — Inpatient Hospital Stay (HOSPITAL_COMMUNITY): Payer: Medicare Other

## 2013-08-04 DIAGNOSIS — J441 Chronic obstructive pulmonary disease with (acute) exacerbation: Secondary | ICD-10-CM | POA: Diagnosis present

## 2013-08-04 DIAGNOSIS — E119 Type 2 diabetes mellitus without complications: Secondary | ICD-10-CM

## 2013-08-04 DIAGNOSIS — M4712 Other spondylosis with myelopathy, cervical region: Principal | ICD-10-CM

## 2013-08-04 LAB — BASIC METABOLIC PANEL
BUN: 25 mg/dL — ABNORMAL HIGH (ref 6–23)
CALCIUM: 8.9 mg/dL (ref 8.4–10.5)
CO2: 26 mEq/L (ref 19–32)
Chloride: 91 mEq/L — ABNORMAL LOW (ref 96–112)
Creatinine, Ser: 0.99 mg/dL (ref 0.50–1.35)
GFR, EST NON AFRICAN AMERICAN: 83 mL/min — AB (ref >90)
Glucose, Bld: 76 mg/dL (ref 70–99)
Potassium: 4.8 mEq/L (ref 3.7–5.3)
SODIUM: 133 meq/L — AB (ref 137–147)

## 2013-08-04 LAB — GLUCOSE, CAPILLARY
GLUCOSE-CAPILLARY: 88 mg/dL (ref 70–99)
GLUCOSE-CAPILLARY: 96 mg/dL (ref 70–99)
GLUCOSE-CAPILLARY: 83 mg/dL (ref 70–99)
Glucose-Capillary: 110 mg/dL — ABNORMAL HIGH (ref 70–99)
Glucose-Capillary: 79 mg/dL (ref 70–99)
Glucose-Capillary: 83 mg/dL (ref 70–99)

## 2013-08-04 MED ORDER — IPRATROPIUM BROMIDE 0.02 % IN SOLN: 0.5000 mg | RESPIRATORY_TRACT | Status: DC | PRN

## 2013-08-04 MED ORDER — BUDESONIDE 0.25 MG/2ML IN SUSP
0.2500 mg | Freq: Two times a day (BID) | RESPIRATORY_TRACT | Status: DC
  Administered 2013-08-04 – 2013-08-05 (×3): 0.25 mg via RESPIRATORY_TRACT
  Filled 2013-08-04 (×5): qty 2

## 2013-08-04 MED ORDER — IOHEXOL 350 MG/ML SOLN
100.0000 mL | Freq: Once | INTRAVENOUS | Status: AC | PRN
  Administered 2013-08-04: 100 mL via INTRAVENOUS

## 2013-08-04 MED ORDER — ALBUTEROL SULFATE (2.5 MG/3ML) 0.083% IN NEBU: 3.0000 mL | INHALATION_SOLUTION | RESPIRATORY_TRACT | Status: DC | PRN

## 2013-08-04 NOTE — Progress Notes (Signed)
Pt had large BM in toilet. Abd still tight but feels better. Ambulated 150 ft in hallway with wife. Tolerated well.

## 2013-08-04 NOTE — Progress Notes (Signed)
Pt had issues with abdominal distention and "bloating" last pm, now much better after a BM and being straight cath once. Has been able to void a little since the straight cath. Otherwise no c/o.  EXAM:  BP 95/64  Pulse 98  Temp(Src) 98.2 F (36.8 C) (Oral)  Resp 20  SpO2 91%  Awake, alert, oriented  Speech fluent, appropriate  CN grossly intact  5/5 BUE/BLE  Wound c/d/i  IMPRESSION:  68 y.o. male POD# 2 s/p C6-7 ACDF with postop constipation and hypoxia which is likely baseline and secondary to his COPD.  PLAN: - Will d/w medicine re: any further mgmt required for COPD/hypoxia - otherwise stable for d/c home today.

## 2013-08-04 NOTE — Progress Notes (Signed)
Pt stated passes a very small amount of stool. Pt states he feels a little bit better and Abd is not as tender. Will continue to assess

## 2013-08-04 NOTE — Progress Notes (Signed)
Pt c/o tenderness in abdomen. Abdomen tight and distended. Bowel sounds hypo active. Pt states not passing gas and had ducolax suppository with no results. Pt denies nausea or vomiting. Dr. Conchita ParisNundkumar paged and made aware of above. Orders for NPO, IV Normal Saline at 75cc/hr, Cxr and Abd xray ordered. Pt may go down to xray. Continue to assess.

## 2013-08-04 NOTE — Discharge Instructions (Signed)
Metformin and X-ray Contrast Studies °For some X-ray exams, a contrast dye is used. Contrast dye is a type of medicine used to make the X-ray image clearer. The contrast dye is given to the patient through a vein (intravenously). If you need to have this type of X-ray exam and you take a medication called metformin, your caregiver may have you stop taking metformin before the exam.  °LACTIC ACIDOSIS °In rare cases, a serious medical condition called lactic acidosis can develop in people who take metformin and receive contrast dye. The following conditions can increase the risk of this complication:  °· Kidney failure. °· Liver problems. °· Certain types of heart problems such as: °· Heart failure. °· Heart attack. °· Heart infection. °· Heart valve problems. °· Alcohol abuse. °If left untreated, lactic acidosis can lead to coma.  °SYMPTOMS OF LACTIC ACIDOSIS °Symptoms of lactic acidosis can include: °· Rapid breathing (hyperventilation). °· Neurologic symptoms such as: °· Headaches. °· Confusion. °· Dizziness. °· Excessive sweating. °· Feeling sick to your stomach (nauseous) or throwing up (vomiting). °AFTER THE X-RAY EXAM °· Stay well-hydrated. Drink fluids as instructed by your caregiver. °· If you have a risk of developing lactic acidosis, blood tests may be done to make sure your kidney function is okay. °· Metformin is usually stopped for 48 hours after the X-ray exam. Ask your caregiver when you can start taking metformin again. °SEEK MEDICAL CARE IF:  °· You have shortness of breath or difficulty breathing. °· You develop a headache that does not go away. °· You have nausea or vomiting. °· You urinate more than normal. °· You develop a skin rash and have: °· Redness. °· Swelling. °· Itching. °Document Released: 04/13/2009 Document Revised: 07/18/2011 Document Reviewed: 04/13/2009 °[employer]® Patient Information ©2014 [employer]. ° °

## 2013-08-04 NOTE — Consult Note (Addendum)
[facility] Medical Consultation  Wanda PlumpMichael Capetillo EXB:[id number] DOB: [birth year] DOA: 08/02/2013 PCP: No primary provider on file.   Requesting physician: Dr. Venetia MaxonStern Date of consultation: 08/04/2013 Reason for consultation: oxygen desaturation   HPI:  68 year old male with past medical history of hypertension, diabetes, COPD not on home oxygen who presented to [facility] 08/02/2013 for cervical stenosis and has underwent anterior cervical decompression/fusion. Hospital course has somewhat been complicated due to episodes of hypoxia. He has CT angio chest which was negative for pulmonary embolism.   Assessment and Plan:  Active Problems: COPD - hypoxia likely due to COPD; CT angio chest negative for PE - he may require adjustment to his COPD regimen which would include nebulizer treatments as needed while he is in the hospital. He already had albuterol but we added atrovent as needed and pulmicort BID scheduled.  - on discharge, he may benefit from Spiriva daily and Atrovent nebulizer as needed every 4-6 hours and albuterol inhaler as needed - PCCM consulted - he can follow up in pulmonary clinic [phone]) in 2 weeks from discharge and have PFT's done to further stage COPD Diabetes - may continue current meds except for metformin which needs to be held for about 24-48 hours after CT scan - may continue glipizide and insulin  Hypertension - continue Hctz and lisinopril  - will follow up again in am to make sure resp status stable  For questions, call Manson Passeylma Devine  [phone] or [phone]  Review of Systems:  Constitutional: Negative for fever, chills, diaphoresis, activity change, appetite change and fatigue.  HENT: Negative for ear pain, nosebleeds, congestion, facial swelling, rhinorrhea, neck pain, neck stiffness and ear discharge.   Eyes: Negative for pain, discharge, redness, itching and visual disturbance.  Respiratory: Negative for cough, choking, chest tightness, shortness of  breath, wheezing and stridor.   Cardiovascular: Negative for chest pain, palpitations and leg swelling.  Gastrointestinal: Negative for abdominal distention.  Genitourinary: Negative for dysuria, urgency, frequency, hematuria, flank pain, decreased urine volume, difficulty urinating and dyspareunia.  Musculoskeletal: Negative for back pain, joint swelling, arthralgias and gait problem.  Neurological: Negative for dizziness, tremors, seizures, syncope, facial asymmetry, speech difficulty, weakness, light-headedness, numbness and headaches.  Hematological: Negative for adenopathy. Does not bruise/bleed easily.  Psychiatric/Behavioral: Negative for hallucinations, behavioral problems, confusion, dysphoric mood, decreased concentration and agitation.     Past Medical History  Diagnosis Date  . Hypertension     takes Metoprolol,Lisopril,HCTZ and Cardura daily  . Hyperlipidemia     takes Fenofibrate daily  . Dysrhythmia     A Fib-takes Coumadin daily  . COPD (chronic obstructive pulmonary disease)     uses Asmanex daily as needed  . History of bronchitis     last time a couple of months   . Sleep apnea     sleep study done in 2007 and doesn't use a cpap  . Jerking     of legs   . Restless leg syndrome     takes Gabapentin daily   . Arthritis   . Low back pain     arthritis  . GERD (gastroesophageal reflux disease)     takes Omeprazole daily  . History of colon polyps   . Enlarged prostate     takes Flomax daily  . Diabetes mellitus without complication     takes Metformin and GLipizide daily  . Depression     takes Effexor daily   . PTSD (post-traumatic stress disorder)   . Insomnia  has tried Ambien nightly as needed  . Complication of anesthesia     paralysis of left diaphragm   Past Surgical History  Procedure Laterality Date  . Rotator cuff surgery Bilateral   . Appendectomy    . Cholecystectomy    . Cervical fusion    . Colonoscopy    .  Esophagogastroduodenoscopy    . Cardioversion     Social History:  reports that he has been smoking.  He has never used smokeless tobacco. He reports that he does not drink alcohol or use illicit drugs.  Allergies  Allergen Reactions  . Ketoprofen Swelling    Tongue swelling   History reviewed. No pertinent family history.  Prior to Admission medications   Medication Sig Start Date End Date Taking? Authorizing Provider  albuterol (PROVENTIL HFA;VENTOLIN HFA) 108 (90 BASE) MCG/ACT inhaler Inhale 1-2 puffs into the lungs every 6 (six) hours as needed for wheezing or shortness of breath.   Yes Historical Provider, MD  aspirin EC 81 MG tablet Take 81 mg by mouth daily.   Yes Historical Provider, MD  cyanocobalamin 500 MCG tablet Take 500 mcg by mouth daily.   Yes Historical Provider, MD  diltiazem (DILACOR XR) 240 MG 24 hr capsule Take 240 mg by mouth daily.   Yes Historical Provider, MD  fenofibrate (TRICOR) 145 MG tablet Take 145 mg by mouth daily.   Yes Historical Provider, MD  gabapentin (NEURONTIN) 300 MG capsule Take 600 mg by mouth 3 (three) times daily.   Yes Historical Provider, MD  glipiZIDE (GLUCOTROL) 5 MG tablet Take 5 mg by mouth daily before breakfast.   Yes Historical Provider, MD  hydrochlorothiazide (HYDRODIURIL) 25 MG tablet Take 25 mg by mouth daily.   Yes Historical Provider, MD  HYDROcodone-acetaminophen (NORCO/VICODIN) 5-325 MG per tablet Take 1 tablet by mouth every 6 (six) hours as needed for moderate pain.   Yes Historical Provider, MD  lisinopril (PRINIVIL,ZESTRIL) 40 MG tablet Take 20 mg by mouth daily.   Yes Historical Provider, MD  magnesium oxide (MAG-OX) 400 MG tablet Take 800 mg by mouth daily.   Yes Historical Provider, MD  metFORMIN (GLUCOPHAGE) 1000 MG tablet Take 1,000 mg by mouth 2 (two) times daily with a meal.   Yes Historical Provider, MD  metoprolol succinate (TOPROL-XL) 50 MG 24 hr tablet Take 50 mg by mouth 2 (two) times daily. Take with or  immediately following a meal.   Yes Historical Provider, MD  mometasone (ASMANEX) 220 MCG/INH inhaler Inhale 2 puffs into the lungs daily.   Yes Historical Provider, MD  omeprazole (PRILOSEC) 20 MG capsule Take 20 mg by mouth daily.   Yes Historical Provider, MD  tamsulosin (FLOMAX) 0.4 MG CAPS capsule Take 0.4 mg by mouth.   Yes Historical Provider, MD  venlafaxine (EFFEXOR) 75 MG tablet Take 75 mg by mouth 2 (two) times daily.   Yes Historical Provider, MD  warfarin (COUMADIN) 5 MG tablet Take 5-7.5 mg by mouth daily. Take 1.5 tablets by mouth on Mon. Take 1 tablet by mouth all other days   Yes Historical Provider, MD  zolpidem (AMBIEN) 10 MG tablet Take 10 mg by mouth at bedtime as needed for sleep.    Historical Provider, MD   Physical Exam: Blood pressure 95/64, pulse 98, temperature 98.2 F (36.8 C), temperature source Oral, resp. rate 20, SpO2 91.00%. Filed Vitals:   08/04/13 0419  BP: 95/64  Pulse: 98  Temp: 98.2 F (36.8 C)  Resp: 20  Physical Exam  Constitutional: Appears in no acute distress Neck: Normal ROM. Neck supple. No JVD. No tracheal deviation.  CVS: RRR, S1/S2 appreciated  Pulmonary: rhonchi in right upper lung lobe appreciated, very mild wheezing Abdominal: Soft. BS +,  no distension, tenderness, rebound or guarding.  Musculoskeletal: Normal range of motion. No tenderness.  Lymphadenopathy: No lymphadenopathy noted, cervical, inguinal. Neuro: Alert. No focal neurologic deficits  Skin: Skin is warm and dry.  Psychiatric: Normal mood and affect.     Labs on Admission:  Basic Metabolic Panel: No results found for this basename: NA, K, CL, CO2, GLUCOSE, BUN, CREATININE, CALCIUM, MG, PHOS,  in the last 168 hours Liver Function Tests: No results found for this basename: AST, ALT, ALKPHOS, BILITOT, PROT, ALBUMIN,  in the last 168 hours No results found for this basename: LIPASE, AMYLASE,  in the last 168 hours No results found for this basename: AMMONIA,  in  the last 168 hours CBC: No results found for this basename: WBC, NEUTROABS, HGB, HCT, MCV, PLT,  in the last 168 hours Cardiac Enzymes: No results found for this basename: CKTOTAL, CKMB, CKMBINDEX, TROPONINI,  in the last 168 hours BNP: No components found with this basename: POCBNP,  CBG:  Recent Labs Lab 08/03/13 1631 08/03/13 2043 08/04/13 0016 08/04/13 0410 08/04/13 0619  GLUCAP 103* 102* 83 88 96    Radiological Exams on Admission: Dg Chest 2 View  08/03/2013   CLINICAL DATA:  Shortness of breath.  Productive cough.  EXAM: CHEST  2 VIEW  COMPARISON:  None.  FINDINGS: Cardiopericardial enlargement. Widening of the lower mediastinum may reflect a hiatal hernia. Elevation of the left diaphragm with left lower lobe opacity. There was likely left diaphragmatic elevation on lumbar spine radiography 07/01/2013. No pneumothorax. The right lung is well aerated.  Recent anterior cervical discectomy and fusion, with no comparison frontal radiography after recent surgery. The right lower screw in has a more vertical orientation than expected.  IMPRESSION: 1. Left diaphragmatic elevation with overlying atelectasis. 2. Multilevel cervical discectomy. The lower right screw has an unexpected vertical orientation. Consider dedicated cervical spine radiographs.   Electronically Signed   By: Tiburcio Pea M.D.   On: 08/03/2013 22:38   Dg Abd 2 Views  08/03/2013   CLINICAL DATA:  Abdominal distention.  EXAM: ABDOMEN - 2 VIEW  COMPARISON:  None.  FINDINGS: There is respiratory motion which mildly decreases sensitivity. No evidence of bowel obstruction. Stool volume within normal limits. No abnormal intra-abdominal mass effect or suspicious calcification. Left diaphragmatic elevation. Lower lumbar facet osteoarthritis. Surgical changes in the right upper quadrant which may reflect cholecystectomy.  IMPRESSION: Nonobstructive bowel gas pattern.   Electronically Signed   By: Tiburcio Pea M.D.   On:  08/03/2013 22:31    Time spent: 25 minutes  Manson Passey [facility] Pager [phone]  If 7PM-7AM, please contact night-coverage [email or web address] Password [facility] 08/04/2013, 11:21 AM

## 2013-08-04 NOTE — Progress Notes (Signed)
Abdomen still tight and distended. Pt states he feels better. Still NPO. Abdomen not as tender.BS alittle easier to hear.Continue to assess

## 2013-08-05 DIAGNOSIS — J449 Chronic obstructive pulmonary disease, unspecified: Secondary | ICD-10-CM

## 2013-08-05 DIAGNOSIS — J9811 Atelectasis: Secondary | ICD-10-CM

## 2013-08-05 DIAGNOSIS — R0902 Hypoxemia: Secondary | ICD-10-CM

## 2013-08-05 DIAGNOSIS — I10 Essential (primary) hypertension: Secondary | ICD-10-CM

## 2013-08-05 DIAGNOSIS — F17200 Nicotine dependence, unspecified, uncomplicated: Secondary | ICD-10-CM

## 2013-08-05 DIAGNOSIS — J986 Disorders of diaphragm: Secondary | ICD-10-CM

## 2013-08-05 DIAGNOSIS — J9819 Other pulmonary collapse: Secondary | ICD-10-CM

## 2013-08-05 LAB — GLUCOSE, CAPILLARY
GLUCOSE-CAPILLARY: 105 mg/dL — AB (ref 70–99)
GLUCOSE-CAPILLARY: 94 mg/dL (ref 70–99)
Glucose-Capillary: 153 mg/dL — ABNORMAL HIGH (ref 70–99)

## 2013-08-05 MED ORDER — OXYCODONE-ACETAMINOPHEN 5-325 MG PO TABS: 1.0000 | ORAL_TABLET | ORAL | Status: AC | PRN

## 2013-08-05 MED ORDER — METFORMIN HCL 1000 MG PO TABS: 1000.0000 mg | ORAL_TABLET | Freq: Two times a day (BID) | ORAL | Status: AC

## 2013-08-05 NOTE — Discharge Summary (Signed)
Physician Discharge Summary  Patient ID: Glenn Morgan MRN: [id number] DOB/AGE: [birth year] 68 y.o.  Admit date: 08/02/2013 Discharge date: 08/05/2013  Admission Diagnoses:  Cervical spondylosis, C6-7  Discharge Diagnoses: Same Principal Problem:   Cervical spondylosis with myelopathy and radiculopathy Active Problems:   COPD exacerbation   Diabetes   COPD (chronic obstructive pulmonary disease)   Diaphragm paralysis   Tobacco use disorder   Hypoxemia   Atelectasis   Discharged Condition: Stable  Hospital Course:  Glenn Morgan is a 68 y.o. male electively admitted after uncomplicated anterior cervical discectomy at C6-7. The patient report good resolution of his preoperative pain symptoms, and was at his neurologic baseline postoperatively. He was angulating normally, tolerating diet, with pain under control with oral medication. Postoperatively, he did have hypoxia with saturations dropping into the 70s after angulating. He was therefore evaluated by the medicine service and CT angiogram did not demonstrate any ulnar embolus. He was further seen by the pulmonary service and was evaluated for home oxygen which he did appear to required. He was otherwise stable for discharge.  Treatments: Surgery - C6-7 anterior cervical discectomy and fusion  Discharge Exam: Blood pressure 94/66, pulse 98, temperature 96.6 F (35.9 C), temperature source Oral, resp. rate 19, weight 91.1 kg (200 lb 13.4 oz), SpO2 94.00%. Awake, alert, oriented Speech fluent, appropriate CN grossly intact 5/5 BUE/BLE Wound c/d/i  Follow-up: Follow-up in Dr. Venetia MaxonStern office [facility] [phone]) in 2 weeks  Disposition: Final discharge disposition not confirmed  Discharge Orders   Future Orders Complete By Expires   For home use only DME oxygen  As directed    Questions:     Mode or (Route):  Nasal cannula   Liters per Minute:     Frequency:  Continuous (stationary and  portable oxygen unit needed)   Oxygen conserving device:         Medication List    STOP taking these medications       warfarin 5 MG tablet  Commonly known as:  COUMADIN      TAKE these medications       albuterol 108 (90 BASE) MCG/ACT inhaler  Commonly known as:  PROVENTIL HFA;VENTOLIN HFA  Inhale 1-2 puffs into the lungs every 6 (six) hours as needed for wheezing or shortness of breath.     aspirin EC 81 MG tablet  Take 81 mg by mouth daily.     cyanocobalamin 500 MCG tablet  Take 500 mcg by mouth daily.     diltiazem 240 MG 24 hr capsule  Commonly known as:  DILACOR XR  Take 240 mg by mouth daily.     fenofibrate 145 MG tablet  Commonly known as:  TRICOR  Take 145 mg by mouth daily.     gabapentin 300 MG capsule  Commonly known as:  NEURONTIN  Take 600 mg by mouth 3 (three) times daily.     glipiZIDE 5 MG tablet  Commonly known as:  GLUCOTROL  Take 5 mg by mouth daily before breakfast.     hydrochlorothiazide 25 MG tablet  Commonly known as:  HYDRODIURIL  Take 25 mg by mouth daily.     HYDROcodone-acetaminophen 5-325 MG per tablet  Commonly known as:  NORCO/VICODIN  Take 1 tablet by mouth every 6 (six) hours as needed for moderate pain.     lisinopril 40 MG tablet  Commonly known as:  PRINIVIL,ZESTRIL  Take 20 mg by mouth daily.     magnesium oxide 400 MG  tablet  Commonly known as:  MAG-OX  Take 800 mg by mouth daily.     metFORMIN 1000 MG tablet  Commonly known as:  GLUCOPHAGE  Take 1 tablet (1,000 mg total) by mouth 2 (two) times daily with a meal.  Start taking on:  08/07/2013     metoprolol succinate 50 MG 24 hr tablet  Commonly known as:  TOPROL-XL  Take 50 mg by mouth 2 (two) times daily. Take with or immediately following a meal.     mometasone 220 MCG/INH inhaler  Commonly known as:  ASMANEX  Inhale 2 puffs into the lungs daily.     omeprazole 20 MG capsule  Commonly known as:  PRILOSEC  Take 20 mg by mouth daily.      oxyCODONE-acetaminophen 5-325 MG per tablet  Commonly known as:  PERCOCET/ROXICET  Take 1-2 tablets by mouth every 4 (four) hours as needed for moderate pain.     tamsulosin 0.4 MG Caps capsule  Commonly known as:  FLOMAX  Take 0.4 mg by mouth.     venlafaxine 75 MG tablet  Commonly known as:  EFFEXOR  Take 75 mg by mouth 2 (two) times daily.     zolpidem 10 MG tablet  Commonly known as:  AMBIEN  Take 10 mg by mouth at bedtime as needed for sleep.         SignedLisbeth Renshaw, C 08/05/2013, 2:27 PM

## 2013-08-05 NOTE — Progress Notes (Signed)
Inpatient Diabetes Program Recommendations  AACE/ADA: New Consensus Statement on Inpatient Glycemic Control (2013)  Target Ranges:  Prepandial:   less than 140 mg/dL      Peak postprandial:   less than 180 mg/dL (1-2 hours)      Critically ill patients:  140 - 180 mg/dL   Consult received for "poor glycemic control".  Patient's A1C=7.9 08/02/13 and home meds for DM are Glucotrol 5mg  with breakfast and Metformin 1000mg  BID with meals.  Patient has been restarted on both meds and glucose is improved.  No further recommendations at this time.  Patient will need to follow-up with PCP to monitor A1C. Thank you  Glenn Morgan BSN, RN,CDE Inpatient Diabetes Coordinator [phone] (team pager)

## 2013-08-05 NOTE — Consult Note (Signed)
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 Chronically reports some exertional dyspnea but no dyspnea at rest. It has increased since after surgery.  Denies orthopnea.  Denies paroxysmal nocturnal dyspnea.  Denies chest pain.  Denies cough.  Reports chronic low back pain which is chronic.  PAST MEDICAL HISTORY :  Past Medical History  Diagnosis Date  . Hypertension     takes Metoprolol,Lisopril,HCTZ and Cardura daily  . Hyperlipidemia     takes Fenofibrate daily  . Dysrhythmia     A Fib-takes Coumadin daily  . COPD (chronic obstructive pulmonary disease)     uses Asmanex daily as needed  . History of bronchitis     last time a couple of months   . Sleep apnea     sleep study done in 2007 and doesn't use a cpap  . Jerking     of legs   . Restless leg syndrome     takes Gabapentin daily   . Arthritis   . Low back pain     arthritis  . GERD (gastroesophageal reflux disease)     takes Omeprazole daily  . History of colon polyps   . Enlarged  prostate     takes Flomax daily  . Diabetes mellitus without complication     takes Metformin and GLipizide daily  . Depression     takes Effexor daily   . PTSD (post-traumatic stress disorder)   . Insomnia     has tried Ambien nightly as needed  . Complication of anesthesia     paralysis of left diaphragm   Past Surgical History  Procedure Laterality Date  . Rotator cuff surgery Bilateral   . Appendectomy    . Cholecystectomy    . Cervical fusion    . Colonoscopy    . Esophagogastroduodenoscopy    . Cardioversion     Prior to Admission medications   Medication Sig Start Date End Date Taking? Authorizing Provider  albuterol (PROVENTIL HFA;VENTOLIN HFA) 108 (90 BASE) MCG/ACT inhaler Inhale 1-2 puffs into the lungs every 6 (six) hours as needed for wheezing or shortness of breath.   Yes Historical Provider, MD  aspirin EC 81 MG tablet Take 81 mg by mouth daily.   Yes Historical Provider, MD  cyanocobalamin 500 MCG tablet Take 500 mcg by mouth daily.   Yes Historical Provider, MD  diltiazem (DILACOR XR) 240 MG 24 hr capsule Take 240 mg by mouth daily.   Yes Historical Provider, MD  fenofibrate (TRICOR) 145 MG tablet Take 145 mg by mouth daily.  Yes Historical Provider, MD  gabapentin (NEURONTIN) 300 MG capsule Take 600 mg by mouth 3 (three) times daily.   Yes Historical Provider, MD  glipiZIDE (GLUCOTROL) 5 MG tablet Take 5 mg by mouth daily before breakfast.   Yes Historical Provider, MD  hydrochlorothiazide (HYDRODIURIL) 25 MG tablet Take 25 mg by mouth daily.   Yes Historical Provider, MD  HYDROcodone-acetaminophen (NORCO/VICODIN) 5-325 MG per tablet Take 1 tablet by mouth every 6 (six) hours as needed for moderate pain.   Yes Historical Provider, MD  lisinopril (PRINIVIL,ZESTRIL) 40 MG tablet Take 20 mg by mouth daily.   Yes Historical Provider, MD  magnesium oxide (MAG-OX) 400 MG tablet Take 800 mg by mouth daily.   Yes Historical Provider, MD  metFORMIN (GLUCOPHAGE) 1000 MG  tablet Take 1,000 mg by mouth 2 (two) times daily with a meal.   Yes Historical Provider, MD  metoprolol succinate (TOPROL-XL) 50 MG 24 hr tablet Take 50 mg by mouth 2 (two) times daily. Take with or immediately following a meal.   Yes Historical Provider, MD  mometasone (ASMANEX) 220 MCG/INH inhaler Inhale 2 puffs into the lungs daily.   Yes Historical Provider, MD  omeprazole (PRILOSEC) 20 MG capsule Take 20 mg by mouth daily.   Yes Historical Provider, MD  tamsulosin (FLOMAX) 0.4 MG CAPS capsule Take 0.4 mg by mouth.   Yes Historical Provider, MD  venlafaxine (EFFEXOR) 75 MG tablet Take 75 mg by mouth 2 (two) times daily.   Yes Historical Provider, MD  warfarin (COUMADIN) 5 MG tablet Take 5-7.5 mg by mouth daily. Take 1.5 tablets by mouth on Mon. Take 1 tablet by mouth all other days   Yes Historical Provider, MD  zolpidem (AMBIEN) 10 MG tablet Take 10 mg by mouth at bedtime as needed for sleep.    Historical Provider, MD   Allergies  Allergen Reactions  . Ketoprofen Swelling    Tongue swelling   FAMILY HISTORY:  History reviewed. No pertinent family history.  SOCIAL HISTORY:  reports that he has been smoking.  He has never used smokeless tobacco. He reports that he does not drink alcohol or use illicit drugs.  REVIEW OF SYSTEMS:   Constitutional: Negative for fever, chills, weight loss, malaise/fatigue and diaphoresis.  HENT: Negative for hearing loss, ear pain, nosebleeds, congestion, sore throat, neck pain, tinnitus and ear discharge.   Eyes: Negative for blurred vision, double vision, photophobia, pain, discharge and redness.  Respiratory: Positive for exertional dyspnea. Negative for cough, hemoptysis, sputum production, wheezing and stridor.   Cardiovascular: Negative for chest pain, palpitations, orthopnea, claudication, leg swelling and PND.  Gastrointestinal: Negative for heartburn, nausea, vomiting, abdominal pain, diarrhea, constipation, blood in stool and melena.   Genitourinary: Negative for dysuria, urgency, frequency, hematuria and flank pain.  Musculoskeletal: Negative for myalgias, back pain, joint pain and falls.  Skin: Negative for itching and rash.  Neurological: Positive for low back pain and leg jerking. Negative for dizziness, tingling, sensory change, speech change, focal weakness, seizures, loss of consciousness, weakness and headaches.  Endo/Heme/Allergies: Negative for environmental allergies and polydipsia. Does not bruise/bleed easily.  SUBJECTIVE:   VITAL SIGNS: Temp:  [97.5 F (36.4 C)-98 F (36.7 C)] 97.7 F (36.5 C) (03/30 0432) Pulse Rate:  [84-95] 84 (03/30 0432) Resp:  [18-20] 18 (03/30 0432) BP: (99-131)/(61-70) 130/61 mmHg (03/30 0432) SpO2:  [93 %-98 %] 96 % (03/30 0921) FiO2 (%):  [36 %] 36 % (03/29 1243) Weight:  [91.1 kg (200 lb 13.4 oz)] 91.1 kg (  200 lb 13.4 oz) (03/30 0432)  PHYSICAL EXAMINATION: General:  Resting comfortably, appears to be in no acute distress Neuro:  Awake, alert, cooperative with examination, non-focal HEENT:  NCAT, PERRL, moist membranes Neck:  Soft, no bruits, no lymphadenopathy, no carotid btuits Cardiovascular:  RRR, no m/r/g Lungs:  Bilateral diminished air entry, few rales, no wheezing Abdomen:  Soft, nontender, bowel sounds present, no organomegaly Musculoskeletal:  Moves all extremities, trace edema, no digital clubbing Skin:  Intact, no rash  LABS:  CBC No results found for this basename: WBC, HGB, HCT, PLT,  in the last 168 hours  Coag's  Recent Labs Lab 08/02/13 0608  APTT 30  INR 1.10   BMET  Recent Labs Lab 08/04/13 1340  NA 133*  K 4.8  CL 91*  CO2 26  BUN 25*  CREATININE 0.99  GLUCOSE 76   Electrolytes  Recent Labs Lab 08/04/13 1340  CALCIUM 8.9   Sepsis Markers No results found for this basename: LATICACIDVEN, PROCALCITON, O2SATVEN,  in the last 168 hours ABG No results found for this basename: PHART, PCO2ART, PO2ART,  in the last 168  hours Liver Enzymes No results found for this basename: AST, ALT, ALKPHOS, BILITOT, ALBUMIN,  in the last 168 hours Cardiac Enzymes No results found for this basename: TROPONINI, PROBNP,  in the last 168 hours Glucose  Recent Labs Lab 08/04/13 0016 08/04/13 0410 08/04/13 0619 08/04/13 1120 08/04/13 1625 08/04/13 2107  GLUCAP 83 88 96 83 79 110*   IMAGING: Dg Chest 2 View  08/03/2013   CLINICAL DATA:  Shortness of breath.  Productive cough.  EXAM: CHEST  2 VIEW  COMPARISON:  None.  FINDINGS: Cardiopericardial enlargement. Widening of the lower mediastinum may reflect a hiatal hernia. Elevation of the left diaphragm with left lower lobe opacity. There was likely left diaphragmatic elevation on lumbar spine radiography 07/01/2013. No pneumothorax. The right lung is well aerated.  Recent anterior cervical discectomy and fusion, with no comparison frontal radiography after recent surgery. The right lower screw in has a more vertical orientation than expected.  IMPRESSION: 1. Left diaphragmatic elevation with overlying atelectasis. 2. Multilevel cervical discectomy. The lower right screw has an unexpected vertical orientation. Consider dedicated cervical spine radiographs.   Electronically Signed   By: Tiburcio Pea M.D.   On: 08/03/2013 22:38   Ct Angio Chest Pe W/cm &/or Wo Cm  08/04/2013   CLINICAL DATA:  Shortness of breath.  EXAM: CT ANGIOGRAPHY CHEST WITH CONTRAST  TECHNIQUE: Multidetector CT imaging of the chest was performed using the standard protocol during bolus administration of intravenous contrast. Multiplanar CT image reconstructions and MIPs were obtained to evaluate the vascular anatomy.  CONTRAST:  OMNIPAQUE IOHEXOL 350 MG/ML SOLN  COMPARISON:  Chest radiograph, 08/03/2013  FINDINGS: No evidence of a pulmonary embolus.  Cardiac silhouette is borderline enlarged. There are moderate coronary artery calcifications. The great vessels are normal in caliber.  There is mediastinal  and milder hilar adenopathy. Reference measurements of 2 right peritracheal lymph nodes are 13 mm and 15 mm in short axis.  Elevation of left hemidiaphragm is stable. There is atelectasis in the left upper lobe and left lower lobe above the elevated hemidiaphragm. There is peribronchial thickening and reticular type opacity, likely also atelectasis, in the right lower lobe. Milder areas of peripheral reticular opacity consistent with interstitial scarring. No convincing edema. Trace pleural fluid is seen bilaterally.  Limited evaluation of the upper abdomen shows fatty infiltration of the liver. There are several  prominent, but not pathologically enlarged, teres celiac lymph nodes.  There are degenerative changes noted throughout the visualized spine. The bones are demineralized. No osteoblastic or osteolytic lesions.  Review of the MIP images confirms the above findings.  IMPRESSION: 1. No evidence of a pulmonary embolus. 2. Elevated left hemidiaphragm associated with left upper lobe and left lower lobe basilar atelectasis. 3. Right lower lobe peribronchial thickening with additional opacity that has also likely atelectasis. Minimal effusions. No convincing pulmonary edema. 4. Nonspecific, mild, mediastinal and hilar adenopathy.   Electronically Signed   By: Amie Portland M.D.   On: 08/04/2013 16:12   Dg Abd 2 Views  08/03/2013   CLINICAL DATA:  Abdominal distention.  EXAM: ABDOMEN - 2 VIEW  COMPARISON:  None.  FINDINGS: There is respiratory motion which mildly decreases sensitivity. No evidence of bowel obstruction. Stool volume within normal limits. No abnormal intra-abdominal mass effect or suspicious calcification. Left diaphragmatic elevation. Lower lumbar facet osteoarthritis. Surgical changes in the right upper quadrant which may reflect cholecystectomy.  IMPRESSION: Nonobstructive bowel gas pattern.   Electronically Signed   By: Tiburcio Pea M.D.   On: 08/03/2013 22:31   ASSESSMENT / PLAN:  COPD  by history, no PFT results available, no evidence of acute bronchospasm / exacerbation. No evidence of chronic hypercarbia based on BMET. 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 Could this be related to cervical myelopathy as well? Splinting due to low back pain may be contributing. Post op atelectasis. No evidence for PE. Hypoxemia, multifactorial, secondary to above.  Could this be chronic and undiagnosed? Tobacco use disorder.   Supplemental oxygen for SpO2>92. May need home oxygen.  If resting oxygenation improves would check ambulatory SpO2 before discharge.  Coughing / deep breathing / incentive spirometry.  Adequate pain control.  Continue preadmission Asmanex / Albuterol.  No indication for systemic steroids / antibiotics.  Will need PFT / Pulmonary follow up.  This can be done at [facility] or [facility] - based on the  patient's choice.  Maintain tobacco avoidance.  PCCM will sign off.  Please re consult if necessary.  I have personally obtained history, examined patient, evaluated and interpreted laboratory and imaging results, reviewed medical records, formulated assessment / plan and placed orders.  Lonia Farber, MD Pulmonary and Critical Care Medicine [facility] Pager: [phone]  08/05/2013, 9:29 AM

## 2013-08-05 NOTE — Progress Notes (Signed)
CSW received consult for COPD Protocol. CSW discussed this consult over progression, and CM states she will follow up with patient. CSW signing off at this time. Please re consult if social work needs arise.  Maree KrabbeLindsay Jezebelle Ledwell, MSW, Theresia MajorsLCSWA [phone]

## 2013-08-05 NOTE — Evaluation (Signed)
Occupational Therapy Evaluation and Discharge Patient Details Name: Glenn Morgan MRN: [id number] DOB: 14-Nov-1945 Today's Date: 08/05/2013    History of Present Illness Right Cervical six-seven Anterior cervical decompression/diskectomy fusion with exploration of fusion Cervical three-six (N/A) -  Right C6-7 Anterior cervical decompression/diskectomy fusion with exploration of fusion C3-6 PEEK cage, plate and autograft/allograft. Pt transferred to 2W from [facility]3C after episode of decreased SpO2 and wheezing per family   Clinical Impression   This 68 yo male admitted and underwent above presents today for a new order (evaled on 08/03/13) for evaluation. In to see pt and wife in room and re-evaled for general mobility and sat monitoring. Pt really needs to be up and walking more (did not do a lot this weekend due to IV hooked back up and bowel distention issues). Asked pt and wife and made tech aware that pt needs to walk at least 2 more times today on 3 liters O2 and to pt/wife that pt needs to continue to do his inspirometer and purse lipped breathing. No further skilled OT/PT needs identified, will sign off.     Follow Up Recommendations  No OT follow up    Equipment Recommendations  None recommended by OT       Precautions / Restrictions Precautions Precautions: Cervical Required Braces or Orthoses: Cervical Brace Cervical Brace: Soft collar;At all times Restrictions Weight Bearing Restrictions: No      Mobility Bed Mobility               General bed mobility comments: Pt up in recliner upon arrival  Transfers Overall transfer level: Independent Equipment used: None             General transfer comment: Pt also ambulated Mod I (a little slower, but safe) this time about 200 feet and O2 sats were checked                         Pertinent Vitals/Pain O2 sats 97% on 4 liters and HR 93 when I entered room. O2 removed with pt on RA and sats 93% with HR 98.  Ambulated pt on RA with O2 sats dropping to 82% and HR 109; reapplied O2 at 3 liters and kept ambulating pt with sats at worst 90% and HR 98.     Hand Dominance Right   Extremity/Trunk Assessment Upper Extremity Assessment Upper Extremity Assessment: Overall WFL for tasks assessed   Lower Extremity Assessment Lower Extremity Assessment: Overall WFL for tasks assessed       Communication Communication Communication: No difficulties   Cognition Arousal/Alertness: Awake/alert Behavior During Therapy: WFL for tasks assessed/performed Overall Cognitive Status: Within Functional Limits for tasks assessed                             Home Living Family/patient expects to be discharged to:: Private residence Living Arrangements: Spouse/significant other Available Help at Discharge: Family Type of Home: House Home Access: Stairs to enter Secretary/administratorntrance Stairs-Number of Steps: 4 Entrance Stairs-Rails: Right Home Layout: One level     Bathroom Shower/Tub: Walk-in shower;Door   Foot LockerBathroom Toilet: Standard     Home Equipment: Grab bars - tub/shower          Prior Functioning/Environment Level of Independence: Independent  End of Session: Equipment Utilized During Treatment: Cervical collar;Oxygen;Gait belt (gait belt only because pt's wife said she thought pt was unsteady--however I did not have to use it even though it was on him)  Activity Tolerance: Patient tolerated treatment well (as long as on O2) Patient left: in chair;with family/visitor present   Time: 1610-9604 OT Time Calculation (min): 21 min Charges:  OT General Charges $OT Visit: 1 Procedure OT Evaluation $OT Re-eval: 1 Procedure OT Treatments $Therapeutic Activity: 8-22 mins  Evette Georges [phone] 08/05/2013, 9:02 AM

## 2013-08-05 NOTE — Progress Notes (Signed)
INITIAL NUTRITION ASSESSMENT  DOCUMENTATION CODES Per approved criteria  -Obesity Unspecified   INTERVENTION: No nutrition intervention at this time --- patient declined RD to follow for nutrition care plan  NUTRITION DIAGNOSIS: Increased nutrient needs related to COPD as evidenced by estimated nutrition needs  Goal: Pt to meet >/= 90% of their estimated nutrition needs   Monitor:  PO & supplemental intake, weight, labs, I/O's  Reason for Assessment: Consult  68 y.o. male  Admitting Dx: Cervical spondylosis with myelopathy and radiculopathy  ASSESSMENT: 68 yo smoker with COPD and L hemidiaphragm paralysis (presumably after complicated cholecystectomy) admitted for cervical decompression / fusion.  Patient s/p procedures: Right C6-7 anterior cervical decompression/diskectomy fusion with exploration of fusion C3-6  Right C6-7 Anterior cervical decompression/diskectomy fusion with exploration of fusion  C3-6 PEEK cage, plate and autograft/allograft  Patient reports his appetite is fair.  Wife at bedside.  Doesn't really care for the hospital food.  Declined addition of nutrition supplements.  Possible discharge today.  Height: Ht Readings from Last 1 Encounters:  07/26/13 5\' 8"  (1.727 m)    Weight: Wt Readings from Last 1 Encounters:  08/05/13 200 lb 13.4 oz (91.1 kg)    Ideal Body Weight: 154 lb  % Ideal Body Weight: 130%  Wt Readings from Last 10 Encounters:  08/05/13 200 lb 13.4 oz (91.1 kg)  08/05/13 200 lb 13.4 oz (91.1 kg)  07/26/13 208 lb 4 oz (94.462 kg)    Usual Body Weight: 208 lb  % Usual Body Weight: 96%  BMI:  Body mass index is 30.54 kg/(m^2).  Estimated Nutritional Needs: Kcal: 2000-2200 Protein: 100-110 gm Fluid: 2.0-2.2 L  Skin: Intact  Diet Order: Carb Control  EDUCATION NEEDS: -No education needs identified at this time   Intake/Output Summary (Last 24 hours) at 08/05/13 1158 Last data filed at 08/05/13 0433  Gross per 24  hour  Intake    350 ml  Output    625 ml  Net   -275 ml    Labs:   Recent Labs Lab 08/04/13 1340  NA 133*  K 4.8  CL 91*  CO2 26  BUN 25*  CREATININE 0.99  CALCIUM 8.9  GLUCOSE 76    CBG (last 3)   Recent Labs  08/04/13 1625 08/04/13 2107 08/05/13 1133  GLUCAP 79 110* 153*    Scheduled Meds: . aspirin EC  81 mg Oral Daily  . budesonide (PULMICORT) nebulizer solution  0.25 mg Nebulization BID  . cyanocobalamin  500 mcg Oral Daily  . diltiazem  240 mg Oral Daily  . docusate sodium  100 mg Oral BID  . fenofibrate  54 mg Oral Daily  . fluticasone  2 puff Inhalation BID  . gabapentin  600 mg Oral TID  . glipiZIDE  5 mg Oral QAC breakfast  . hydrochlorothiazide  25 mg Oral Daily  . insulin aspart  0-15 Units Subcutaneous TID WC  . insulin aspart  0-5 Units Subcutaneous QHS  . insulin aspart  4 Units Subcutaneous TID WC  . lisinopril  20 mg Oral Daily  . magnesium oxide  800 mg Oral Daily  . metFORMIN  1,000 mg Oral BID WC  . metoprolol succinate  50 mg Oral BID  . pantoprazole  40 mg Oral Daily  . senna  1 tablet Oral BID  . tamsulosin  0.4 mg Oral QPC supper  . venlafaxine  75 mg Oral BID    Continuous Infusions: . sodium chloride 75 mL/hr at 08/04/13 1016  Past Medical History  Diagnosis Date  . Hypertension     takes Metoprolol,Lisopril,HCTZ and Cardura daily  . Hyperlipidemia     takes Fenofibrate daily  . Dysrhythmia     A Fib-takes Coumadin daily  . COPD (chronic obstructive pulmonary disease)     uses Asmanex daily as needed  . History of bronchitis     last time a couple of months   . Sleep apnea     sleep study done in 2007 and doesn't use a cpap  . Jerking     of legs   . Restless leg syndrome     takes Gabapentin daily   . Arthritis   . Low back pain     arthritis  . GERD (gastroesophageal reflux disease)     takes Omeprazole daily  . History of colon polyps   . Enlarged prostate     takes Flomax daily  . Diabetes mellitus  without complication     takes Metformin and GLipizide daily  . Depression     takes Effexor daily   . PTSD (post-traumatic stress disorder)   . Insomnia     has tried Ambien nightly as needed  . Complication of anesthesia     paralysis of left diaphragm    Past Surgical History  Procedure Laterality Date  . Rotator cuff surgery Bilateral   . Appendectomy    . Cholecystectomy    . Cervical fusion    . Colonoscopy    . Esophagogastroduodenoscopy    . Cardioversion      Maureen Chatters, RD, LDN Pager #: [phone] After-Hours Pager #: [phone]

## 2013-08-05 NOTE — Progress Notes (Signed)
Pt/family given discharge instructions, medication lists, follow up appointments, and when to call the doctor.  Pt/family verbalizes understanding. Pt given signs and symptoms of infection. Hildreth Robart McClintock, RN    

## 2013-08-05 NOTE — Progress Notes (Signed)
Physical Therapy Discharge Patient Details Name: Glenn PlumpMichael Morgan MRN: [id number] DOB: [birth year] Today's Date: 08/05/2013 Time:  -     Patient discharged from PT services secondary to once again OT states that pt has no PT needs.  He needs to ambulate with staff and wife in conjuction with doing his pursed lip breathing.  All education about cervical surgery completed in OT session..   08/05/2013  Crowder BingKen Christalynn [place]ise, PT [phone]  (pager)      [name], [name] 08/05/2013, 9:23 AM

## 2013-08-05 NOTE — Progress Notes (Signed)
SATURATION QUALIFICATIONS: (This note is used to comply with regulatory documentation for home oxygen)  Patient Saturations on Room Air at Rest = 93%  Patient Saturations on Room Air while Ambulating = 82%  Patient Saturations on 3 Liters of oxygen while Ambulating = 92%  Please briefly explain why patient needs home oxygen: needs oxygen

## 2013-08-05 NOTE — Care Management Note (Signed)
    Page 1 of 1   08/05/2013     4:35:56 PM   CARE MANAGEMENT NOTE 08/05/2013  Patient:  Glenn Morgan,Glenn Morgan   Account Number:  [id number]  Date Initiated:  08/05/2013  Documentation initiated by:  Naiomi Musto  Subjective/Objective Assessment:   PT S/P C-6 ACD AND FUSION ON 3/27 WITH POST OP AFIB.  PTA, PT INDEPENDENT, LIVES WITH SPOUSE.     Action/Plan:   PT FOR DC HOME TODAY; WILL NEED HOME OXYGEN, AS UNABLE TO WEAN FROM O2.   Anticipated DC Date:  08/05/2013   Anticipated DC Plan:  HOME/SELF CARE      DC Planning Services  CM consult      Choice offered to / List presented to:     DME arranged  [facility]      DME agency  [facility]        Status of service:  Completed, signed off Medicare Important Message given?   (If response is "NO", the following Medicare IM given date fields will be blank) Date Medicare IM given:   Date Additional Medicare IM given:    Discharge Disposition:  HOME/SELF CARE  Per UR Regulation:  Reviewed for med. necessity/level of care/duration of stay  If discussed at Long Length of Stay Meetings, dates discussed:    Comments:  08/05/13 Tyan Dy,RN,BSN [id number] [facility].  PORTABLE TANK TO BE DELIVERED TO PT'S ROOM PRIOR TO DC HOME.

## 2013-08-05 NOTE — Progress Notes (Signed)
SATURATION QUALIFICATIONS: (This note is used to comply with regulatory documentation for home oxygen)  Patient Saturations on Room Air at Rest =93%  Patient Saturations on Room Air while Ambulating = 82%  Patient Saturations on 3 Liters of oxygen while Ambulating = 90%  Please briefly explain why patient needs home oxygen:

## 2013-08-05 NOTE — Progress Notes (Signed)
[facility] PROGRESS NOTE  Colm Lyford ZOX:[id number] DOB: February 08, 1946 DOA: 08/02/2013 PCP: No primary provider on file.  Brief narrative:  68 year old male with past medical history of hypertension, diabetes, COPD not on home oxygen who presented to [facility] 08/02/2013 for cervical stenosis and has underwent anterior cervical decompression/fusion. Hospital course has somewhat been complicated due to episodes of hypoxia. He has CT angio chest which was negative for pulmonary embolism.   Assessment and Plan:   Active Problems:  COPD  - hypoxia likely due to COPD; CT angio chest negative for PE; pt still hypoxic when taken off of oxygen support; he ambulated without [place] and his oxygen sats dropped to hig 70's to 80's. - appreciate pulmonary consult, they will see the pt today - currently has pulmicort BID nebulizer as well as albuterol and Atrovent PRN nebulizer treatment  Diabetes, uncontrolled with complications of neuropathy  - A1c 7.9 on this admission; asked diabetic coordinator to recommend if other antihyperglycemic med is necessary  - may continue current meds except for metformin which needs to be held for about 24-48 hours after CT scan  - may continue glipizide and insulin SSI as well as novolog 4 units TID - gabapentin for neuropathic pain Hypertension  - continue Hctz and lisinopril - continue Cardizem 240 mg daily and metoprolol 50 mg PO BID - renal function WNL - BP stable and at goal, 130/61 Dyslipidemia - continue fenofibrate  Code Status: Full  Family Communication: Pt at bedside  Disposition Plan: Per primary team   Manson Passey, MD  [facility] Pager [phone]  If 7PM-7AM, please contact night-coverage [email or web address] Password [email or web address] 08/05/2013, 8:27 AM   LOS: 3 days   HPI/Subjective: No events overnight.   Objective: Filed Vitals:   08/04/13 2054 08/04/13 2104 08/04/13 2147 08/05/13 0432  BP:  99/70 107/67 130/61  Pulse:  90 95 84  Temp:  98 F  (36.7 C)  97.7 F (36.5 C)  TempSrc:  Oral  Oral  Resp:  18  18  Weight:    91.1 kg (200 lb 13.4 oz)  SpO2: 94% 96%  95%    Intake/Output Summary (Last 24 hours) at 08/05/13 0827 Last data filed at 08/05/13 0433  Gross per 24 hour  Intake    575 ml  Output    625 ml  Net    -50 ml    Exam:   General:  Pt is alert, follows commands appropriately, not in acute distress  Cardiovascular: Regular rate and rhythm, S1/S2 appreciated   Respiratory: neck collar;   Abdomen: Soft, non tender, non distended, bowel sounds present, no guarding  Extremities: No edema, pulses DP and PT palpable bilaterally  Neuro: Grossly nonfocal  Data Reviewed: Basic Metabolic Panel:  Recent Labs Lab 08/04/13 1340  NA 133*  K 4.8  CL 91*  CO2 26  GLUCOSE 76  BUN 25*  CREATININE 0.99  CALCIUM 8.9   Liver Function Tests: No results found for this basename: AST, ALT, ALKPHOS, BILITOT, PROT, ALBUMIN,  in the last 168 hours No results found for this basename: LIPASE, AMYLASE,  in the last 168 hours No results found for this basename: AMMONIA,  in the last 168 hours CBC: No results found for this basename: WBC, NEUTROABS, HGB, HCT, MCV, PLT,  in the last 168 hours Cardiac Enzymes: No results found for this basename: CKTOTAL, CKMB, CKMBINDEX, TROPONINI,  in the last 168 hours BNP: No components found with this basename: POCBNP,  CBG:  Recent  Labs Lab 08/04/13 0410 08/04/13 0619 08/04/13 1120 08/04/13 1625 08/04/13 2107  GLUCAP 88 96 83 79 110*    Recent Results (from the past 240 hour(s))  SURGICAL PCR SCREEN     Status: Abnormal   Collection Time    07/26/13  1:36 PM      Result Value Ref Range Status   MRSA, PCR NEGATIVE  NEGATIVE Final   Staphylococcus aureus POSITIVE (*) NEGATIVE Final   Comment:            The Xpert SA Assay (FDA     approved for NASAL specimens     in patients over 66 years of age),     is one component of     a comprehensive surveillance      program.  Test performance has     been validated by [facility] for patients greater     than or equal to 66 year old.     It is not intended     to diagnose infection nor to     guide or monitor treatment.     Studies:  Dg Chest 2 View  08/03/2013  Left diaphragmatic elevation with overlying atelectasis. Multilevel cervical discectomy. The lower right screw has an unexpected vertical orientation.   Ct Angio Chest Pe W/cm &/or Wo Cm   08/04/2013  No evidence of a pulmonary embolus. Elevated left hemidiaphragm associated with left upper lobe and left lower lobe basilar atelectasis. Right lower lobe peribronchial thickening with additional opacity that has also likely atelectasis. Minimal effusions. No convincing pulmonary edema. Nonspecific, mild, mediastinal and hilar adenopathy.   Dg Abd 2 Views   08/03/2013   Nonobstructive bowel gas pattern.    Scheduled Meds: . aspirin EC  81 mg Oral Daily  . budesonide (PULMICORT) nebulizer solution  0.25 mg Nebulization BID  . cyanocobalamin  500 mcg Oral Daily  . diltiazem  240 mg Oral Daily  . docusate sodium  100 mg Oral BID  . fenofibrate  54 mg Oral Daily  . fluticasone  2 puff Inhalation BID  . gabapentin  600 mg Oral TID  . glipiZIDE  5 mg Oral QAC breakfast  . hydrochlorothiazide  25 mg Oral Daily  . insulin aspart  0-15 Units Subcutaneous TID WC  . insulin aspart  0-5 Units Subcutaneous QHS  . insulin aspart  4 Units Subcutaneous TID WC  . lisinopril  20 mg Oral Daily  . magnesium oxide  800 mg Oral Daily  . metFORMIN  1,000 mg Oral BID WC  . metoprolol succinate  50 mg Oral BID  . pantoprazole  40 mg Oral Daily  . senna  1 tablet Oral BID  . sodium chloride  3 mL Intravenous Q12H  . tamsulosin  0.4 mg Oral QPC supper  . venlafaxine  75 mg Oral BID   Continuous Infusions: . sodium chloride 75 mL/hr at 08/04/13 1016

## 2013-08-07 ENCOUNTER — Encounter (HOSPITAL_COMMUNITY): Payer: Self-pay | Admitting: Neurosurgery

## 2013-08-08 ENCOUNTER — Encounter (HOSPITAL_COMMUNITY): Payer: Self-pay | Admitting: Neurosurgery

## 2013-08-23 ENCOUNTER — Telehealth: Payer: Self-pay

## 2013-08-23 NOTE — Telephone Encounter (Signed)
Left message for patient to call back  

## 2013-08-23 NOTE — Telephone Encounter (Signed)
Message copied by Annett FabianJONES, Pasqual Farias L on Fri Aug 23, 2013  9:42 AM ------      Message from: Claudette HeadSTARK, MALCOLM T      Created: Wed Aug 21, 2013  5:31 PM       Dr Shela CommonsJ. Venetia MaxonStern called last evening to make an urgent office referral for diarrhea, abd distention. He would like the patient to be seen Thurs or Fri in the office. Not sure if he already has a GI MD. He would be a new patient for us. May want to check if he already has a GI MD. ------

## 2013-08-26 NOTE — Telephone Encounter (Signed)
Left message for patient to call back  

## 2013-08-28 NOTE — Telephone Encounter (Signed)
No return call from patient.

## 2015-02-25 IMAGING — CT CT ANGIO CHEST
2 of 8 series · 17 of 36 positions shown · IV contrast (CONTRAST)
Comparison: Chest radiograph, 08/03/2013

CLINICAL DATA: Shortness of breath.

EXAM:
CT ANGIOGRAPHY CHEST WITH CONTRAST
TECHNIQUE: Multidetector CT imaging of the chest was performed using the
standard protocol during bolus administration of intravenous
contrast. Multiplanar CT image reconstructions and MIPs were
obtained to evaluate the vascular anatomy.
CONTRAST:  100mL OMNIPAQUE IOHEXOL 350 MG/ML SOLN

[Series 6: pe thins · axial · 0.80mm/px · z∈[-16,+217]mm · 16 of 265 slices shown]
[im 16/265  lung]
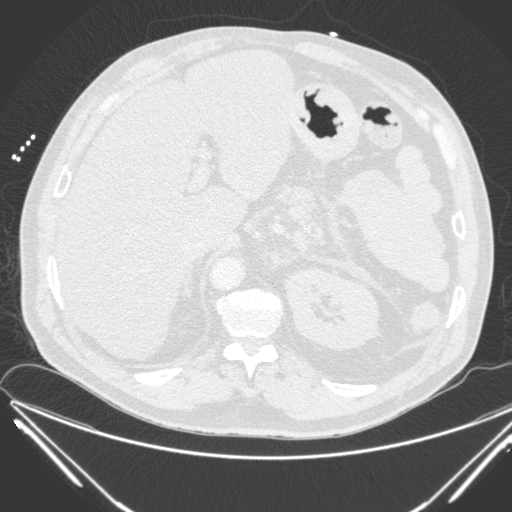
[im 32/265  mediastinal]
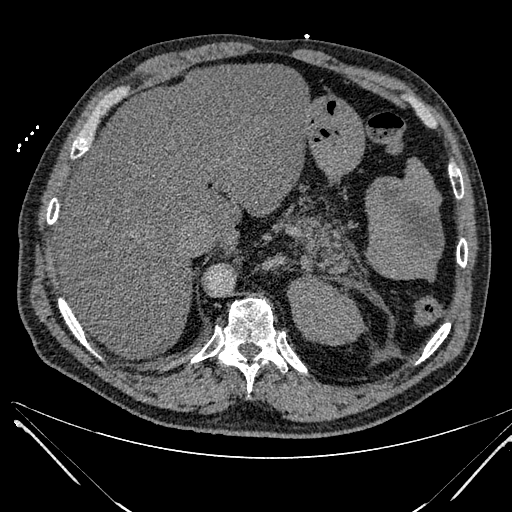
[im 47/265  lung]
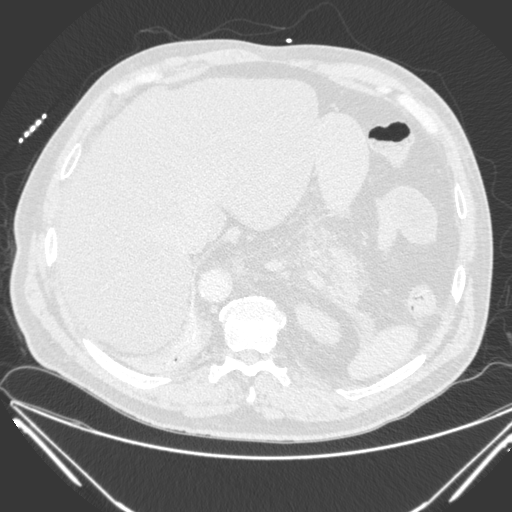
[im 63/265  mediastinal]
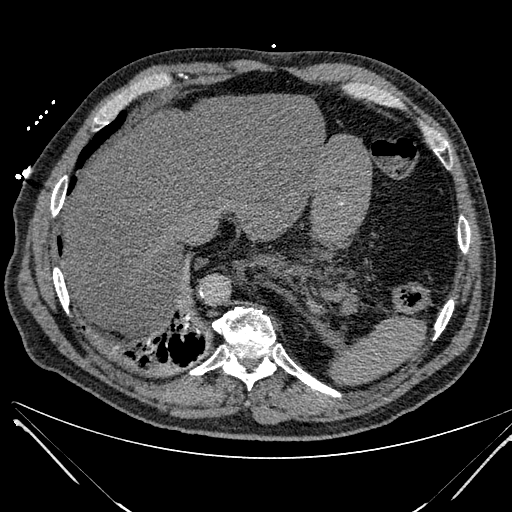
[im 78/265  lung]
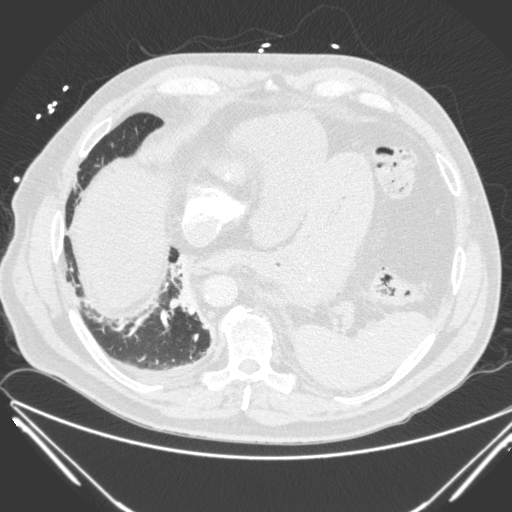
[im 94/265  mediastinal]
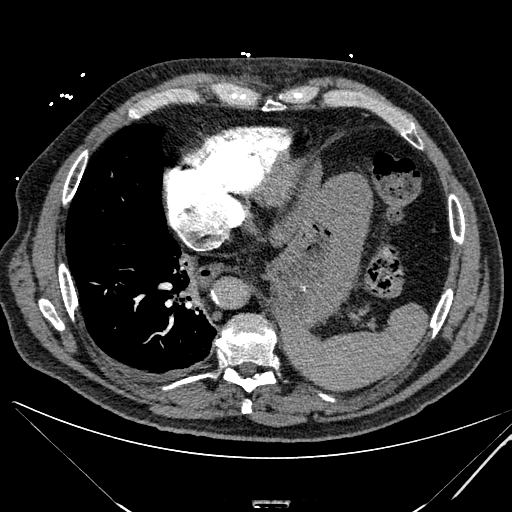
[im 109/265  lung]
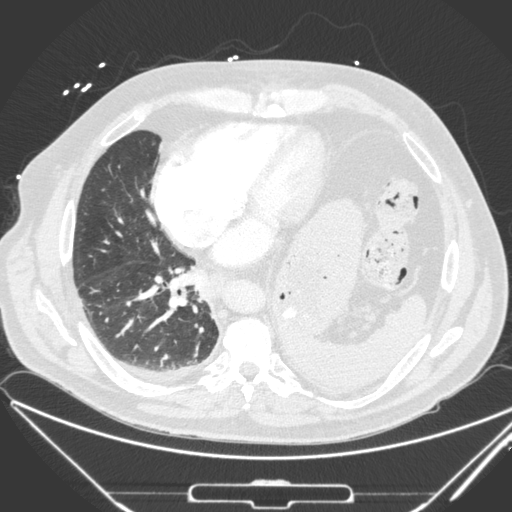
[im 125/265  mediastinal]
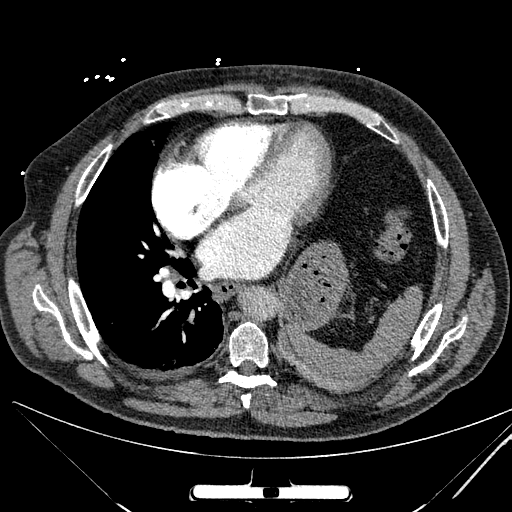
[im 140/265  lung]
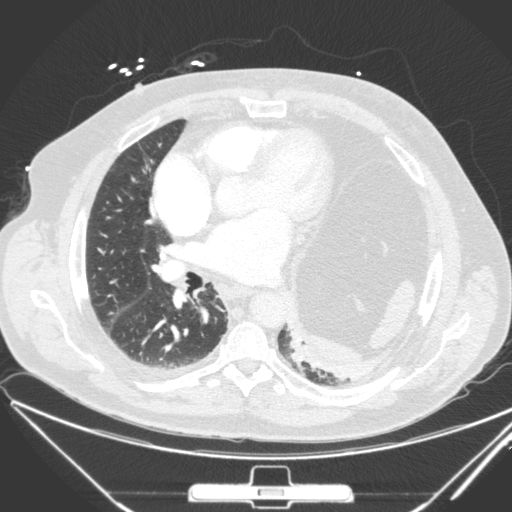
[im 156/265  mediastinal]
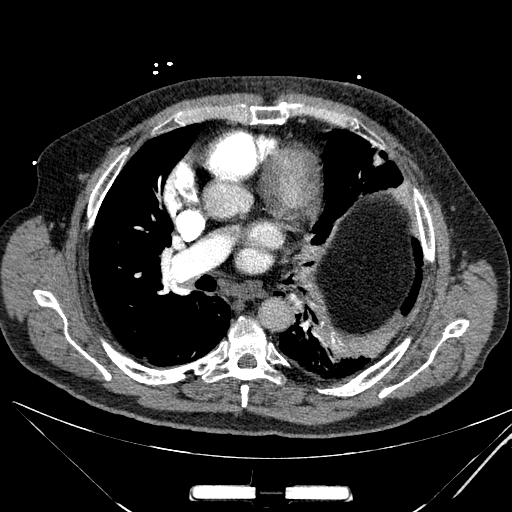
[im 171/265  lung]
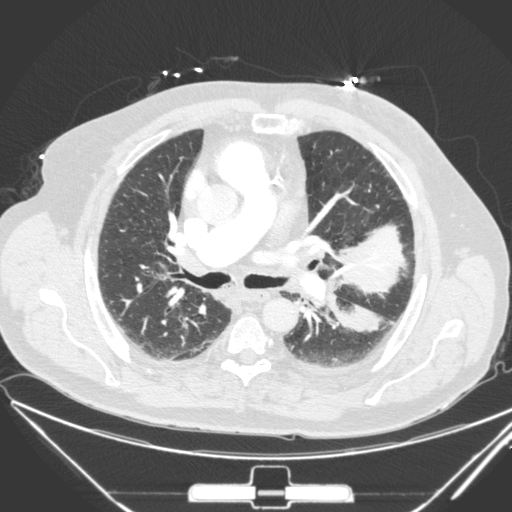
[im 187/265  mediastinal]
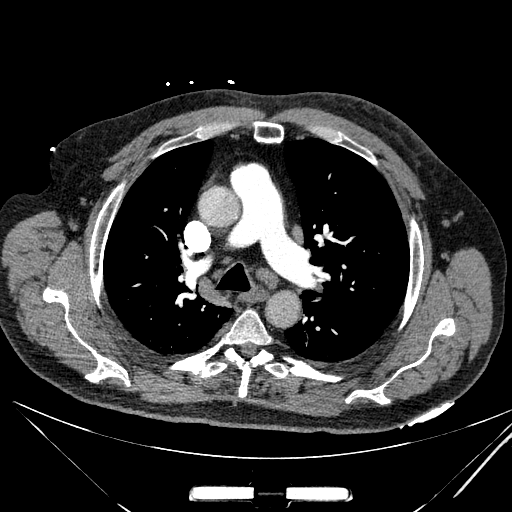
[im 202/265  lung]
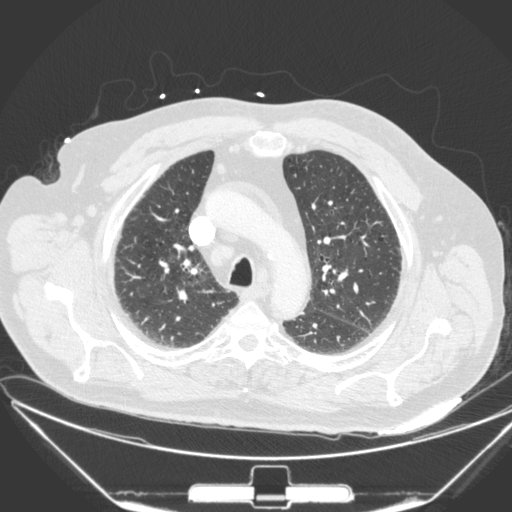
[im 218/265  mediastinal]
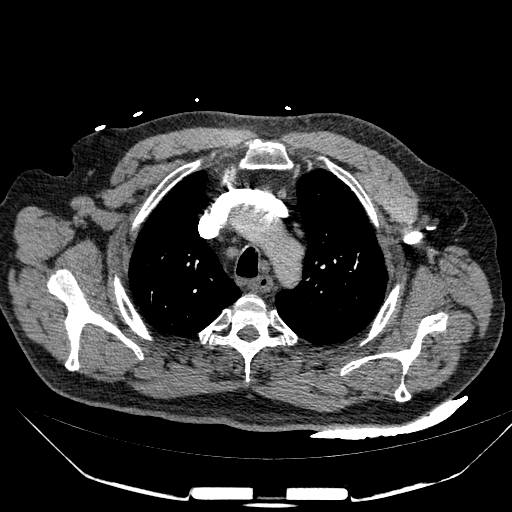
[im 233/265  lung]
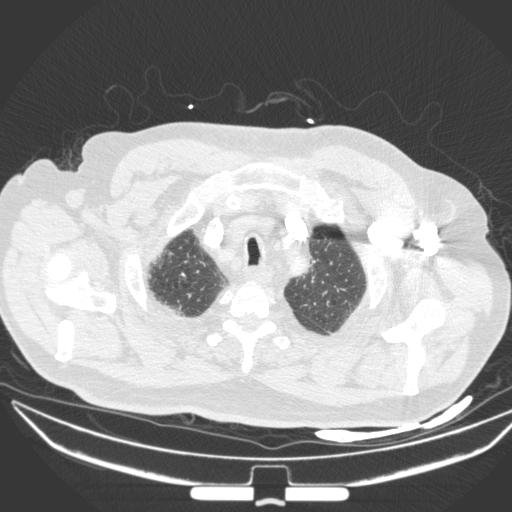
[im 249/265  mediastinal]
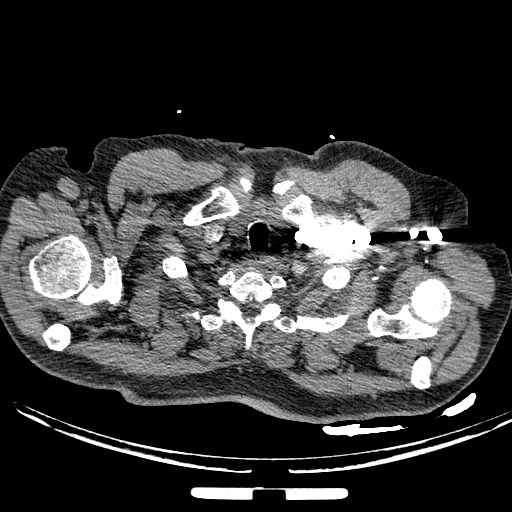

[mpr, coronals, coronal · coronal · 0.80mm/px · 1 of 149 slices shown]
[im 75/149  mediastinal]
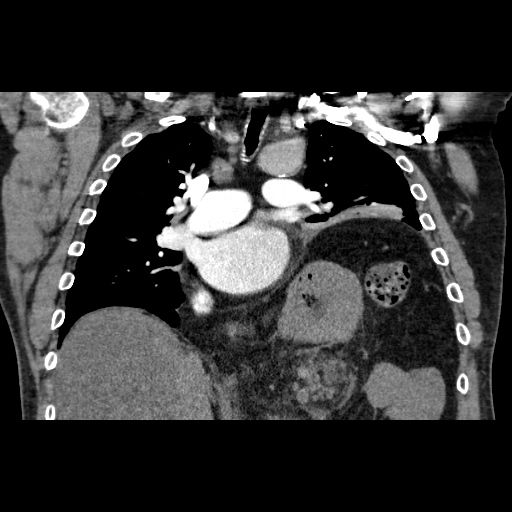

[17 of 36 positions shown; findings below may reference images not displayed]

FINDINGS: No evidence of a pulmonary embolus.

Cardiac silhouette is borderline enlarged. There are moderate
coronary artery calcifications. The great vessels are normal in
caliber.

There is mediastinal and milder hilar adenopathy. Reference
measurements of 2 right peritracheal lymph nodes are 13 mm and 15 mm
in short axis.

Elevation of left hemidiaphragm is stable. There is atelectasis in
the left upper lobe and left lower lobe above the elevated
hemidiaphragm. There is peribronchial thickening and reticular type
opacity, likely also atelectasis, in the right lower lobe. Milder
areas of peripheral reticular opacity consistent with interstitial
scarring. No convincing edema. Trace pleural fluid is seen
bilaterally.

Limited evaluation of the upper abdomen shows fatty infiltration of
the liver. There are several prominent, but not pathologically
enlarged, teres celiac lymph nodes.

There are degenerative changes noted throughout the visualized
spine. The bones are demineralized. No osteoblastic or osteolytic
lesions.

Review of the MIP images confirms the above findings.
IMPRESSION: 1. No evidence of a pulmonary embolus.
2. Elevated left hemidiaphragm associated with left upper lobe and
left lower lobe basilar atelectasis.
3. Right lower lobe peribronchial thickening with additional opacity
that has also likely atelectasis. Minimal effusions. No convincing
pulmonary edema.
4. Nonspecific, mild, mediastinal and hilar adenopathy.

## 2016-03-09 DEATH — deceased
